# Patient Record
Sex: Male | Born: 1965 | Race: Black or African American | Hispanic: No | Marital: Married | Smoking: Former smoker
Health system: Southern US, Community
[De-identification: ages and names within clinical notes are randomized; demographics above are authoritative.]

## PROBLEM LIST (undated history)

## (undated) DIAGNOSIS — E669 Obesity, unspecified: Secondary | ICD-10-CM

## (undated) DIAGNOSIS — E785 Hyperlipidemia, unspecified: Secondary | ICD-10-CM

## (undated) DIAGNOSIS — G4730 Sleep apnea, unspecified: Secondary | ICD-10-CM

## (undated) DIAGNOSIS — E1165 Type 2 diabetes mellitus with hyperglycemia: Secondary | ICD-10-CM

## (undated) DIAGNOSIS — IMO0002 Reserved for concepts with insufficient information to code with codable children: Secondary | ICD-10-CM

## (undated) DIAGNOSIS — G4733 Obstructive sleep apnea (adult) (pediatric): Secondary | ICD-10-CM

## (undated) DIAGNOSIS — K219 Gastro-esophageal reflux disease without esophagitis: Secondary | ICD-10-CM

## (undated) DIAGNOSIS — I10 Essential (primary) hypertension: Secondary | ICD-10-CM

## (undated) DIAGNOSIS — T7840XA Allergy, unspecified, initial encounter: Secondary | ICD-10-CM

## (undated) HISTORY — DX: Sleep apnea, unspecified: G47.30

## (undated) HISTORY — DX: Hyperlipidemia, unspecified: E78.5

## (undated) HISTORY — DX: Obstructive sleep apnea (adult) (pediatric): G47.33

## (undated) HISTORY — DX: Essential (primary) hypertension: I10

## (undated) HISTORY — DX: Obesity, unspecified: E66.9

## (undated) HISTORY — DX: Type 2 diabetes mellitus with hyperglycemia: E11.65

## (undated) HISTORY — DX: Reserved for concepts with insufficient information to code with codable children: IMO0002

## (undated) HISTORY — DX: Allergy, unspecified, initial encounter: T78.40XA

## (undated) HISTORY — PX: TRIGGER FINGER RELEASE: SHX641

---

## 1999-03-04 ENCOUNTER — Emergency Department (HOSPITAL_COMMUNITY): Admission: EM | Admit: 1999-03-04 | Discharge: 1999-03-04 | Payer: Self-pay | Admitting: *Deleted

## 2000-01-08 ENCOUNTER — Ambulatory Visit (HOSPITAL_BASED_OUTPATIENT_CLINIC_OR_DEPARTMENT_OTHER): Admission: RE | Admit: 2000-01-08 | Discharge: 2000-01-08 | Payer: Self-pay | Admitting: Family Medicine

## 2000-03-14 ENCOUNTER — Ambulatory Visit (HOSPITAL_BASED_OUTPATIENT_CLINIC_OR_DEPARTMENT_OTHER): Admission: RE | Admit: 2000-03-14 | Discharge: 2000-03-14 | Payer: Self-pay | Admitting: Family Medicine

## 2002-09-02 ENCOUNTER — Emergency Department (HOSPITAL_COMMUNITY): Admission: EM | Admit: 2002-09-02 | Discharge: 2002-09-02 | Payer: Self-pay | Admitting: Emergency Medicine

## 2002-09-02 ENCOUNTER — Encounter: Payer: Self-pay | Admitting: Emergency Medicine

## 2004-06-02 ENCOUNTER — Ambulatory Visit (HOSPITAL_COMMUNITY): Admission: RE | Admit: 2004-06-02 | Discharge: 2004-06-02 | Payer: Self-pay | Admitting: *Deleted

## 2004-11-15 ENCOUNTER — Emergency Department (HOSPITAL_COMMUNITY): Admission: EM | Admit: 2004-11-15 | Discharge: 2004-11-16 | Payer: Self-pay | Admitting: Emergency Medicine

## 2006-10-01 ENCOUNTER — Ambulatory Visit: Payer: Self-pay | Admitting: Family Medicine

## 2006-10-08 ENCOUNTER — Ambulatory Visit: Payer: Self-pay | Admitting: Family Medicine

## 2006-10-15 ENCOUNTER — Encounter: Admission: RE | Admit: 2006-10-15 | Discharge: 2007-01-13 | Payer: Self-pay | Admitting: Family Medicine

## 2007-01-22 ENCOUNTER — Ambulatory Visit: Payer: Self-pay | Admitting: Family Medicine

## 2007-01-22 ENCOUNTER — Encounter: Admission: RE | Admit: 2007-01-22 | Discharge: 2007-01-22 | Payer: Self-pay | Admitting: Family Medicine

## 2007-11-22 ENCOUNTER — Ambulatory Visit: Payer: Self-pay | Admitting: Family Medicine

## 2007-11-29 ENCOUNTER — Ambulatory Visit: Payer: Self-pay | Admitting: Family Medicine

## 2008-01-06 ENCOUNTER — Ambulatory Visit: Payer: Self-pay | Admitting: Family Medicine

## 2008-01-08 ENCOUNTER — Encounter: Admission: RE | Admit: 2008-01-08 | Discharge: 2008-01-08 | Payer: Self-pay | Admitting: Family Medicine

## 2008-03-02 ENCOUNTER — Ambulatory Visit: Payer: Self-pay | Admitting: Family Medicine

## 2008-03-09 ENCOUNTER — Ambulatory Visit: Payer: Self-pay | Admitting: Family Medicine

## 2008-04-23 ENCOUNTER — Ambulatory Visit: Payer: Self-pay | Admitting: Family Medicine

## 2008-06-08 ENCOUNTER — Ambulatory Visit: Payer: Self-pay | Admitting: Family Medicine

## 2008-06-11 ENCOUNTER — Ambulatory Visit: Payer: Self-pay | Admitting: Family Medicine

## 2008-06-12 ENCOUNTER — Ambulatory Visit: Payer: Self-pay | Admitting: Family Medicine

## 2009-03-17 ENCOUNTER — Ambulatory Visit: Payer: Self-pay | Admitting: Family Medicine

## 2010-04-12 ENCOUNTER — Inpatient Hospital Stay (HOSPITAL_COMMUNITY): Admission: AD | Admit: 2010-04-12 | Discharge: 2010-04-14 | Payer: Self-pay | Admitting: Internal Medicine

## 2010-04-12 ENCOUNTER — Ambulatory Visit: Payer: Self-pay | Admitting: Diagnostic Radiology

## 2010-04-12 ENCOUNTER — Encounter: Payer: Self-pay | Admitting: Emergency Medicine

## 2010-04-12 ENCOUNTER — Ambulatory Visit: Payer: Self-pay | Admitting: Vascular Surgery

## 2010-04-18 ENCOUNTER — Ambulatory Visit: Payer: Self-pay | Admitting: Family Medicine

## 2010-05-19 ENCOUNTER — Ambulatory Visit: Payer: Self-pay | Admitting: Family Medicine

## 2010-09-01 ENCOUNTER — Ambulatory Visit (INDEPENDENT_AMBULATORY_CARE_PROVIDER_SITE_OTHER): Payer: 59 | Admitting: Family Medicine

## 2010-09-01 ENCOUNTER — Ambulatory Visit: Admit: 2010-09-01 | Payer: Self-pay | Admitting: Family Medicine

## 2010-09-01 DIAGNOSIS — E119 Type 2 diabetes mellitus without complications: Secondary | ICD-10-CM

## 2010-09-01 DIAGNOSIS — E785 Hyperlipidemia, unspecified: Secondary | ICD-10-CM

## 2010-09-01 DIAGNOSIS — E669 Obesity, unspecified: Secondary | ICD-10-CM

## 2010-09-01 DIAGNOSIS — I10 Essential (primary) hypertension: Secondary | ICD-10-CM

## 2010-10-13 LAB — LIPID PANEL
LDL Cholesterol: 133 mg/dL — ABNORMAL HIGH (ref 0–99)
Triglycerides: 131 mg/dL (ref <150)

## 2010-10-13 LAB — POCT CARDIAC MARKERS: Myoglobin, poc: 52.2 ng/mL (ref 12–200)

## 2010-10-13 LAB — PROTIME-INR
INR: 0.98 (ref 0.00–1.49)
Prothrombin Time: 13.2 seconds (ref 11.6–15.2)

## 2010-10-13 LAB — DIFFERENTIAL
Eosinophils Relative: 1 % (ref 0–5)
Lymphocytes Relative: 34 % (ref 12–46)
Lymphs Abs: 1.4 10*3/uL (ref 0.7–4.0)
Neutrophils Relative %: 55 % (ref 43–77)

## 2010-10-13 LAB — LIPASE, BLOOD: Lipase: 24 U/L (ref 11–59)

## 2010-10-13 LAB — GLUCOSE, CAPILLARY
Glucose-Capillary: 103 mg/dL — ABNORMAL HIGH (ref 70–99)
Glucose-Capillary: 148 mg/dL — ABNORMAL HIGH (ref 70–99)
Glucose-Capillary: 168 mg/dL — ABNORMAL HIGH (ref 70–99)

## 2010-10-13 LAB — CARDIAC PANEL(CRET KIN+CKTOT+MB+TROPI)
CK, MB: 1.2 ng/mL (ref 0.3–4.0)
Total CK: 98 U/L (ref 7–232)

## 2010-10-13 LAB — D-DIMER, QUANTITATIVE
D-Dimer, Quant: 2.5 ug/mL-FEU — ABNORMAL HIGH (ref 0.00–0.48)
D-Dimer, Quant: 2.57 ug/mL-FEU — ABNORMAL HIGH (ref 0.00–0.48)

## 2010-10-13 LAB — CBC
Hemoglobin: 15.1 g/dL (ref 13.0–17.0)
MCV: 84.7 fL (ref 78.0–100.0)
Platelets: 90 10*3/uL — ABNORMAL LOW (ref 150–400)
RBC: 5.36 MIL/uL (ref 4.22–5.81)
WBC: 4 10*3/uL (ref 4.0–10.5)

## 2010-10-13 LAB — BASIC METABOLIC PANEL
Chloride: 104 mEq/L (ref 96–112)
Creatinine, Ser: 0.9 mg/dL (ref 0.4–1.5)
GFR calc Af Amer: >60 mL/min (ref >60)
Potassium: 4.2 mEq/L (ref 3.5–5.1)
Sodium: 138 mEq/L (ref 135–145)

## 2010-10-13 LAB — HEMOGLOBIN A1C: Mean Plasma Glucose: 192 mg/dL — ABNORMAL HIGH (ref <117)

## 2010-11-21 ENCOUNTER — Ambulatory Visit: Payer: 59

## 2010-11-23 ENCOUNTER — Ambulatory Visit (INDEPENDENT_AMBULATORY_CARE_PROVIDER_SITE_OTHER): Payer: 59 | Admitting: *Deleted

## 2010-11-23 DIAGNOSIS — L0291 Cutaneous abscess, unspecified: Secondary | ICD-10-CM

## 2010-12-31 ENCOUNTER — Encounter: Payer: Self-pay | Admitting: Family Medicine

## 2010-12-31 DIAGNOSIS — G4730 Sleep apnea, unspecified: Secondary | ICD-10-CM | POA: Insufficient documentation

## 2010-12-31 DIAGNOSIS — E785 Hyperlipidemia, unspecified: Secondary | ICD-10-CM | POA: Insufficient documentation

## 2010-12-31 DIAGNOSIS — E1169 Type 2 diabetes mellitus with other specified complication: Secondary | ICD-10-CM | POA: Insufficient documentation

## 2010-12-31 DIAGNOSIS — E669 Obesity, unspecified: Secondary | ICD-10-CM | POA: Insufficient documentation

## 2011-01-03 ENCOUNTER — Ambulatory Visit: Payer: 59 | Admitting: Family Medicine

## 2011-03-06 ENCOUNTER — Ambulatory Visit (INDEPENDENT_AMBULATORY_CARE_PROVIDER_SITE_OTHER): Payer: 59 | Admitting: Medical

## 2011-03-06 ENCOUNTER — Encounter: Payer: Self-pay | Admitting: Medical

## 2011-03-06 VITALS — BP 110/80 | HR 98 | Temp 98.4°F | Ht 71.0 in | Wt 351.0 lb

## 2011-03-06 DIAGNOSIS — H5712 Ocular pain, left eye: Secondary | ICD-10-CM

## 2011-03-06 DIAGNOSIS — H5710 Ocular pain, unspecified eye: Secondary | ICD-10-CM

## 2011-03-06 DIAGNOSIS — S0592XA Unspecified injury of left eye and orbit, initial encounter: Secondary | ICD-10-CM

## 2011-03-06 DIAGNOSIS — S0510XA Contusion of eyeball and orbital tissues, unspecified eye, initial encounter: Secondary | ICD-10-CM

## 2011-03-06 MED ORDER — FLUORESCEIN SODIUM 1 MG OP STRP: 1.0000 | ORAL_STRIP | Freq: Once | OPHTHALMIC | Status: AC

## 2011-03-06 NOTE — Progress Notes (Signed)
Subjective:   HPI Mathew Ramsey is a 45 y.o. male who presents for left eye problem.  He notes 2 days ago he was playing with an air dart gun, and after it wouldn't shoot, he looked down the barrel and accidentally shot himself in the left eye with a rubber dart.  Since then he has redness, pain, irritation with bright light, watery drainage.  Vision seems off in the left eye.  No other aggravating or relieving factors.  No other c/o.  The following portions of the patient's history were reviewed and updated as appropriate: allergies, current medications, past family history, past medical history, past social history, past surgical history and problem list.   Review of Systems Gen: no fever, chills, sweats Skin: no rash Neuro: no numbness, tingling, headache     Objective:   Physical Exam  General appearance: alert, no distress, WD/WN, black male SKin: no periorbital erythema or ecchymosis. Eyes: tender over left globe, mild medial erythema, PERRLA, EOMi, normal red reflex, visual acuity listed as below, fluorescein stain negative for ulceration or abrasion, right eye exam normal     Assessment :    Encounter Diagnoses  Name Primary?  . Left eye trauma Yes  . Left eye pain       Plan:    Advised eye rest, and we will have him f/u with ophthalmology tomorrow for further eval.  There office was closed since it is already after 5pm. He has seen ophthalmology prior for routine care.

## 2011-05-08 ENCOUNTER — Encounter: Payer: 59 | Admitting: Family Medicine

## 2011-05-08 DIAGNOSIS — E669 Obesity, unspecified: Secondary | ICD-10-CM

## 2011-05-08 DIAGNOSIS — A084 Viral intestinal infection, unspecified: Secondary | ICD-10-CM

## 2011-05-08 DIAGNOSIS — E119 Type 2 diabetes mellitus without complications: Secondary | ICD-10-CM | POA: Insufficient documentation

## 2011-05-08 MED ORDER — METFORMIN HCL 850 MG PO TABS: 850.0000 mg | ORAL_TABLET | Freq: Two times a day (BID) | ORAL | Status: DC

## 2011-05-08 MED ORDER — PROMETHAZINE HCL 25 MG/ML IJ SOLN: 25.0000 mg | Freq: Four times a day (QID) | INTRAMUSCULAR | Status: DC | PRN

## 2011-05-08 MED ORDER — PROMETHAZINE HCL 50 MG/ML IJ SOLN: 50.0000 mg | Freq: Four times a day (QID) | INTRAMUSCULAR | Status: DC | PRN

## 2011-05-08 MED ORDER — PROMETHAZINE HCL 25 MG PO TABS: 25.0000 mg | ORAL_TABLET | Freq: Four times a day (QID) | ORAL | Status: DC | PRN

## 2011-05-08 NOTE — Progress Notes (Signed)
  Subjective:    Patient ID: ERASMUS BISTLINE, male    DOB: [birth year], 45 y.o.   MRN: [id number]  HPI He woke up this morning feeling fine and then became dizzy and had an episode of nausea and vomiting. No fever, chills, sore throat or earache. His blood sugars run in the low 100's. He has been losing weight lately states he has not really been trying. He continues on medications listed in the chart.   Review of Systems     Objective:   Physical Exam alert and in no distress. Tympanic membranes and canals are normal. Throat is clear. Tonsils are normal. Neck is supple without adenopathy or thyromegaly. Cardiac exam shows a regular sinus rhythm without murmurs or gallops. Lungs are clear to auscultation. Abdominal exam shows decreased bowel sounds without masses or tenderness. Hemoglobin A1c is 8.2.        Assessment & Plan:   1. Diabetes mellitus  POCT HgB A1C  2. Viral gastroenteritis  DISCONTINUED: promethazine (PHENERGAN) injection 25 mg  3. Obesity (BMI 30-39.9)     Phenergan injection and pills were given. Encouraged him to continue with his weight loss. I will increase his metformin to 850 twice a day. Return here in 4 months or sooner if further trouble

## 2011-05-08 NOTE — Patient Instructions (Signed)
Use the Phenergan as needed for your nausea and vomiting. Continue with your weight loss

## 2011-05-09 ENCOUNTER — Other Ambulatory Visit: Payer: Self-pay

## 2011-05-09 MED ORDER — PROMETHAZINE HCL 25 MG PO TABS: 25.0000 mg | ORAL_TABLET | Freq: Four times a day (QID) | ORAL | Status: AC | PRN

## 2011-07-18 ENCOUNTER — Ambulatory Visit (INDEPENDENT_AMBULATORY_CARE_PROVIDER_SITE_OTHER): Payer: 59 | Admitting: Medical

## 2011-07-18 ENCOUNTER — Other Ambulatory Visit: Payer: Self-pay | Admitting: Medical

## 2011-07-18 ENCOUNTER — Ambulatory Visit
Admission: RE | Admit: 2011-07-18 | Discharge: 2011-07-18 | Disposition: A | Discharge status: Home / Self Care | Payer: 59 | Source: Ambulatory Visit | Attending: Medical | Admitting: Medical

## 2011-07-18 ENCOUNTER — Encounter: Payer: Self-pay | Admitting: Medical

## 2011-07-18 VITALS — BP 120/88 | HR 72 | Temp 98.2°F | Resp 18 | Wt 359.0 lb

## 2011-07-18 DIAGNOSIS — M25562 Pain in left knee: Secondary | ICD-10-CM

## 2011-07-18 DIAGNOSIS — R52 Pain, unspecified: Secondary | ICD-10-CM

## 2011-07-18 DIAGNOSIS — M79609 Pain in unspecified limb: Secondary | ICD-10-CM

## 2011-07-18 DIAGNOSIS — M25569 Pain in unspecified knee: Secondary | ICD-10-CM

## 2011-07-18 DIAGNOSIS — M79606 Pain in leg, unspecified: Secondary | ICD-10-CM

## 2011-07-18 NOTE — Progress Notes (Signed)
Subjective:   HPI  Mathew Ramsey is a 45 y.o. male who presents with worsening left knee and calve pain.  Feels like knot in calve and behind knee.  He notes a playing kickball back in the summer, stopped abruptly, felt pop in knee, felt pulling burning pain.  Has given him problems intermittent since then.  Also lately feels pain in left calve.  Denies swelling, redness, numbness, no tingling.   Denies trauma.  No other injury.  No hx/o DVT.  No other aggravating or relieving factors.    No other c/o.  The following portions of the patient's history were reviewed and updated as appropriate: allergies, current medications, past family history, past medical history, past social history, past surgical history and problem list.  Past Medical History  Diagnosis Date  . Obesity   . Diabetes mellitus   . Hypertension   . Allergy   . Dyslipidemia   . Sleep apnea     Review of Systems Constitutional: -fever, -chills, -sweats, -unexpected -weight change,-fatigue ENT: -runny nose, -ear pain, -sore throat Cardiology:  -chest pain, -palpitations, +edema Respiratory: -cough, -shortness of breath, -wheezing Gastroenterology: -abdominal pain, -nausea, -vomiting, -diarrhea, -constipation Hematology: -bleeding or bruising problems Musculoskeletal: -arthralgias, -myalgias, -joint swelling, -back pain, +KNEE PAIN Ophthalmology: -vision changes Urology: -dysuria, -difficulty urinating, -hematuria, -urinary frequency, -urgency Neurology: -headache, -weakness, -tingling, -numbness     Objective:   Physical Exam  Filed Vitals:   07/18/11 1555  BP: 120/88  Pulse: 72  Temp: 98.2 F (36.8 C)  Resp: 18    General appearance: alert, no distress, WD/WN, obese black male Skin: unremarkable MSK: tender anteromedial left knee joint line, tender over posterior knee/popliteal area, tender left upper calve, but no cord, no obvious signs of clot, no asymmetry or swelling, otherwise left knee without  laxity, special tests normal, rest of LE exam unremarkable Pulses: 2+ symmetric,lower extremities, normal cap refill Neuro: normal strength and sensation of LE   Assessment and Plan :    Encounter Diagnoses  Name Primary?  . Knee pain, left Yes  . Leg pain, inferior    Etiology unclear.  Will send for xray.  I suspect a soft tissue injury such as tear, but exam is noncontributory.  Dr. Susann Givens also examined him and reviewed plan.  Follow-up pending xray.

## 2011-07-19 ENCOUNTER — Telehealth: Payer: Self-pay | Admitting: Medical

## 2011-07-19 NOTE — Progress Notes (Signed)
Addended by: Janeice Brewton on: 07/19/2011 02:19 PM   Modules accepted: Orders

## 2011-07-22 ENCOUNTER — Inpatient Hospital Stay: Admission: RE | Admit: 2011-07-22 | Payer: 59 | Source: Ambulatory Visit

## 2011-08-02 NOTE — Telephone Encounter (Signed)
TSD  

## 2011-09-11 ENCOUNTER — Encounter: Payer: Self-pay | Admitting: Family Medicine

## 2011-09-11 ENCOUNTER — Ambulatory Visit (INDEPENDENT_AMBULATORY_CARE_PROVIDER_SITE_OTHER): Payer: 59 | Admitting: Family Medicine

## 2011-09-11 DIAGNOSIS — E119 Type 2 diabetes mellitus without complications: Secondary | ICD-10-CM

## 2011-09-11 DIAGNOSIS — E669 Obesity, unspecified: Secondary | ICD-10-CM

## 2011-09-11 DIAGNOSIS — R6882 Decreased libido: Secondary | ICD-10-CM

## 2011-09-11 DIAGNOSIS — E785 Hyperlipidemia, unspecified: Secondary | ICD-10-CM

## 2011-09-11 DIAGNOSIS — G4730 Sleep apnea, unspecified: Secondary | ICD-10-CM

## 2011-09-11 DIAGNOSIS — M25569 Pain in unspecified knee: Secondary | ICD-10-CM

## 2011-09-11 DIAGNOSIS — Z79899 Other long term (current) drug therapy: Secondary | ICD-10-CM

## 2011-09-11 DIAGNOSIS — M25562 Pain in left knee: Secondary | ICD-10-CM

## 2011-09-11 LAB — COMPREHENSIVE METABOLIC PANEL
ALT: 30 U/L (ref 0–53)
AST: 22 U/L (ref 0–37)
BUN: 10 mg/dL (ref 6–23)
CO2: 26 mEq/L (ref 19–32)
Creat: 0.96 mg/dL (ref 0.50–1.35)
Total Bilirubin: 0.7 mg/dL (ref 0.3–1.2)

## 2011-09-11 LAB — LIPID PANEL
Cholesterol: 219 mg/dL — ABNORMAL HIGH (ref 0–200)
HDL: 52 mg/dL (ref >39)
Total CHOL/HDL Ratio: 4.2 Ratio
VLDL: 28 mg/dL (ref 0–40)

## 2011-09-11 LAB — CBC WITH DIFFERENTIAL/PLATELET
Basophils Absolute: 0 10*3/uL (ref 0.0–0.1)
Eosinophils Absolute: 0 10*3/uL (ref 0.0–0.7)
Eosinophils Relative: 1 % (ref 0–5)
MCH: 27.1 pg (ref 26.0–34.0)
MCHC: 32.8 g/dL (ref 30.0–36.0)
MCV: 82.5 fL (ref 78.0–100.0)
Monocytes Absolute: 0.3 10*3/uL (ref 0.1–1.0)
Platelets: 147 10*3/uL — ABNORMAL LOW (ref 150–400)
RDW: 13.8 % (ref 11.5–15.5)

## 2011-09-11 LAB — POCT UA - MICROALBUMIN: Creatinine, POC: 282.6 mg/dL

## 2011-09-11 MED ORDER — SIMVASTATIN 20 MG PO TABS: 20.0000 mg | ORAL_TABLET | Freq: Every day | ORAL | Status: DC

## 2011-09-11 MED ORDER — BENAZEPRIL HCL 10 MG PO TABS: 10.0000 mg | ORAL_TABLET | Freq: Every day | ORAL | Status: DC

## 2011-09-11 MED ORDER — CARVEDILOL 6.25 MG PO TABS: 6.2500 mg | ORAL_TABLET | Freq: Two times a day (BID) | ORAL | Status: DC

## 2011-09-11 MED ORDER — METFORMIN HCL 850 MG PO TABS: 850.0000 mg | ORAL_TABLET | Freq: Two times a day (BID) | ORAL | Status: DC

## 2011-09-11 NOTE — Patient Instructions (Signed)
Make an appointment with Darryl Hyers. I think this will be very helpful for you

## 2011-09-11 NOTE — Progress Notes (Signed)
  Subjective:    Patient ID: Mathew Ramsey, male    DOB: January 07, 1966, 46 y.o.   MRN: [id number]  HPI He is here for a followup visit. He states that the knee pain has greatly diminished. He has noted that when he drinks beer, the pain does recur. He has cut back on his alcohol consumption. He has had no popping, locking or grinding. He then discussed the fact that his entire life he has had difficulty doing things. He has had a very negative approach towards practically anything however when he would've been involved in the activity, he would do very well and enjoy it He recently ran out of several of his medications. He has not followed up on diabetes education. Nor does he exercise regularly. His work and home life seems to be going well. He does have underlying sleep apnea. He was told by his wife to let you know that he also has decreased libido. Further discussion indicates he also has decreased energy, strength and stamina. He does not check his sugars regularly.   Review of Systems Negative except as above    Objective:   Physical Exam Alert and in no distress otherwise not examined       Assessment & Plan:   1. Obesity  Ambulatory referral to diabetic education  2. Knee pain, left    3. Dyslipidemia  Lipid panel  4. Diabetes mellitus  CBC with Differential, Comprehensive metabolic panel, Lipid panel, POCT UA - Microalbumin, POCT HgB A1C, Ambulatory referral to diabetic education  5. Encounter for long-term (current) use of other medications  CBC with Differential, Comprehensive metabolic panel, Lipid panel  6. Libido, decreased    7. Sleep apnea     we had a long discussion concerning the psychological impact his negative he has had on his entire life. I recommended that he get counseling with Darryl Hyers to deal with all these issues. We'll set him up again for diabetes education. Discussed diet and exercise with him. I will also check testosterone level. Over 45 minutes spent  discussing all these issues with him.

## 2011-09-13 ENCOUNTER — Telehealth: Payer: Self-pay | Admitting: Internal Medicine

## 2011-09-13 MED ORDER — CARVEDILOL 6.25 MG PO TABS: 6.2500 mg | ORAL_TABLET | Freq: Two times a day (BID) | ORAL | Status: DC

## 2011-09-13 NOTE — Telephone Encounter (Signed)
Pt takes carvedilol twice a day so that would make it 180 for a 3 month supply so i just changed the quantity from 90 to 180

## 2011-10-30 ENCOUNTER — Telehealth: Payer: Self-pay | Admitting: Internal Medicine

## 2011-10-30 MED ORDER — METFORMIN HCL 850 MG PO TABS: 850.0000 mg | ORAL_TABLET | Freq: Two times a day (BID) | ORAL | Status: DC

## 2011-10-30 MED ORDER — SIMVASTATIN 20 MG PO TABS: 20.0000 mg | ORAL_TABLET | Freq: Every day | ORAL | Status: DC

## 2011-10-30 MED ORDER — BENAZEPRIL HCL 10 MG PO TABS: 10.0000 mg | ORAL_TABLET | Freq: Every day | ORAL | Status: DC

## 2011-10-30 MED ORDER — CARVEDILOL 6.25 MG PO TABS: 6.2500 mg | ORAL_TABLET | Freq: Two times a day (BID) | ORAL | Status: DC

## 2011-10-30 NOTE — Telephone Encounter (Signed)
Resent again per pt

## 2011-10-30 NOTE — Telephone Encounter (Signed)
pt wife called for [facility] stating that [facility] had the wrong address in the computer and that [facility] would not fill his meds from february. Pt needs a refill again since the address is now correct on benazepril 10mg ,simvastatin 20mg ,metformin 850mg , carvedilol 6.25 sent to [facility]

## 2011-10-30 NOTE — Telephone Encounter (Signed)
Resent med per pt

## 2011-10-30 NOTE — Telephone Encounter (Signed)
pts wife called and requested a [facility] [address] until they can get the meds through [facility]. Cheri refill benazepril 10mg  #90 with 3 refills, carvedilol 6.25mg  #180 with 3 refills. Metformin 850mg  #180 with 1 refill and simvastatin 20mg  #90 with 3 refills. And I refilled them all for a month and sent [facility] [address] since pt is out of it.

## 2011-11-02 ENCOUNTER — Ambulatory Visit (INDEPENDENT_AMBULATORY_CARE_PROVIDER_SITE_OTHER): Payer: 59 | Admitting: Family Medicine

## 2011-11-02 ENCOUNTER — Encounter: Payer: Self-pay | Admitting: Family Medicine

## 2011-11-02 VITALS — BP 140/90 | HR 60 | Wt 362.0 lb

## 2011-11-02 DIAGNOSIS — R1011 Right upper quadrant pain: Secondary | ICD-10-CM

## 2011-11-02 NOTE — Progress Notes (Signed)
  Subjective:    Patient ID: Mathew Ramsey, male    DOB: April 16, 1966, 46 y.o.   MRN: [id number]  HPI He complains of a several month history of swelling in the right upper quadrant that was made worse with foods. Approximately 5 days ago he had viral symptoms of weakness, headache, diaphoresis and diarrhea. Apparently for the people at work had the same problem. He also noted after that that eating food caused increased discomfort, bloating and gas in the right upper quadrant.   Review of Systems     Objective:   Physical Exam 13 in no distress. Cardiac exam shows regular rhythm without murmurs or gallops. Lungs clear to auscultation. Abdominal exam shows positive Murphy sign and Murphy's punch with decreased bowel sounds       Assessment & Plan:   1. Right upper quadrant pain  US Abdomen Limited

## 2011-11-06 ENCOUNTER — Ambulatory Visit
Admission: RE | Admit: 2011-11-06 | Discharge: 2011-11-06 | Disposition: A | Discharge status: Home / Self Care | Payer: 59 | Source: Ambulatory Visit | Attending: Family Medicine | Admitting: Family Medicine

## 2011-11-06 ENCOUNTER — Other Ambulatory Visit: Payer: Self-pay

## 2011-11-06 DIAGNOSIS — R1011 Right upper quadrant pain: Secondary | ICD-10-CM

## 2011-11-13 ENCOUNTER — Telehealth: Payer: Self-pay | Admitting: Family Medicine

## 2011-11-13 NOTE — Telephone Encounter (Signed)
Pt informed that HIDA scan should not cause symptoms of claustrophobia.

## 2011-11-13 NOTE — Telephone Encounter (Signed)
Pt has Heidis Scan scheduled for tomorrow and wants something rx for the claustrophobia.  [facility] [address].  Please let him know if that can be done.

## 2011-11-13 NOTE — Telephone Encounter (Signed)
Explained to him that this should not cause symptoms of claustrophobia

## 2011-11-14 ENCOUNTER — Inpatient Hospital Stay (HOSPITAL_COMMUNITY): Admission: RE | Admit: 2011-11-14 | Payer: 59 | Source: Ambulatory Visit

## 2011-11-15 ENCOUNTER — Telehealth: Payer: Self-pay | Admitting: Internal Medicine

## 2011-11-15 NOTE — Telephone Encounter (Signed)
Called pt and left message on cell that HYDA SCAN is scheduled for April 26,2013 @8 :00am @ [facility]. Nothing to drink or eat after midnight and DO NOT take meds the morning of scan.. Prior Auth was approved. Confirmation # [id number].

## 2011-11-24 ENCOUNTER — Encounter (HOSPITAL_COMMUNITY): Payer: Self-pay

## 2011-11-24 ENCOUNTER — Encounter (HOSPITAL_COMMUNITY)
Admission: RE | Admit: 2011-11-24 | Discharge: 2011-11-24 | Disposition: A | Discharge status: Home / Self Care | Payer: 59 | Source: Ambulatory Visit | Attending: Family Medicine | Admitting: Family Medicine

## 2011-11-24 DIAGNOSIS — R1011 Right upper quadrant pain: Secondary | ICD-10-CM | POA: Insufficient documentation

## 2011-11-24 MED ORDER — TECHNETIUM TC 99M MEBROFENIN IV KIT
5.5000 | PACK | Freq: Once | INTRAVENOUS | Status: AC | PRN
  Administered 2011-11-24: 5.5 via INTRAVENOUS

## 2011-11-24 MED ORDER — SINCALIDE 5 MCG IJ SOLR: 0.0200 ug/kg | Freq: Once | INTRAMUSCULAR | Status: DC

## 2011-11-28 ENCOUNTER — Telehealth: Payer: Self-pay

## 2011-11-28 NOTE — Telephone Encounter (Signed)
Pt has called and wanted results from Hida scan from Friday it is in his chart but there are no comments from you that I can see please advise

## 2011-11-28 NOTE — Telephone Encounter (Signed)
Left message on machine word for word 

## 2011-11-28 NOTE — Telephone Encounter (Signed)
There is no evidence of gallbladder disease. Make sure he is on a PPI and followup with me in 2-4 weeks

## 2011-12-05 ENCOUNTER — Telehealth: Payer: Self-pay

## 2011-12-05 ENCOUNTER — Ambulatory Visit: Payer: 59 | Admitting: *Deleted

## 2011-12-05 NOTE — Telephone Encounter (Signed)
Wife called [facility] today said they had not received any info on hida scan called husband the Pt Kahner I informed him that I did call at 12:10 pm and left message word for word of results that no evidence of gallbladder problems and to take Pepcid and to follow up with Dr.Lalonde in 2 to 4 weeks pt said ok

## 2012-01-08 ENCOUNTER — Ambulatory Visit (INDEPENDENT_AMBULATORY_CARE_PROVIDER_SITE_OTHER): Payer: 59 | Admitting: Family Medicine

## 2012-01-08 ENCOUNTER — Encounter: Payer: Self-pay | Admitting: Family Medicine

## 2012-01-08 VITALS — BP 130/80 | HR 106 | Wt 360.0 lb

## 2012-01-08 DIAGNOSIS — E785 Hyperlipidemia, unspecified: Secondary | ICD-10-CM

## 2012-01-08 DIAGNOSIS — Z9119 Patient's noncompliance with other medical treatment and regimen: Secondary | ICD-10-CM

## 2012-01-08 DIAGNOSIS — E119 Type 2 diabetes mellitus without complications: Secondary | ICD-10-CM

## 2012-01-08 DIAGNOSIS — G4730 Sleep apnea, unspecified: Secondary | ICD-10-CM

## 2012-01-08 DIAGNOSIS — E669 Obesity, unspecified: Secondary | ICD-10-CM

## 2012-01-08 NOTE — Patient Instructions (Signed)
Change your mind

## 2012-01-08 NOTE — Progress Notes (Signed)
  Subjective:    Patient ID: Mathew Ramsey, male    DOB: [birth year], 46 y.o.   MRN: [id number]  HPI He is here for a diabetes recheck. He continues on medications listed in the chart. His social history was reviewed. He does not check his sugars regularly, exercise is quite minimal and he has yet to make permanent eating habit changes. Review his record indicates he did fairly good job in his originally diagnosed however he has subsequently had difficulty staying on task. He is not using his CPAP at the present time.   Review of Systems     Objective:   Physical Exam Alert and in no distress. Hemoglobin A1c is 10.1.       Assessment & Plan:   1. Type II or unspecified type diabetes mellitus without mention of complication, not stated as uncontrolled  POCT HgB A1C  2. Obesity    3. Dyslipidemia    4. Diabetes mellitus    5. Sleep apnea    6. Personal history of noncompliance with medical treatment, presenting hazards to health     I again had a long discussion with him concerning his noncompliance and wrist is overall health. Strongly encouraged him to make permanent lifestyle changes in regard to his diet and exercise. Also encouraged him to start using his CPAP machine again. Recheck here in several months. He did state that when he quit smoking he needed to just change his mind which was my strong suggestion for him in regard to his diet and exercise.

## 2012-01-25 ENCOUNTER — Emergency Department (HOSPITAL_BASED_OUTPATIENT_CLINIC_OR_DEPARTMENT_OTHER): Payer: 59

## 2012-01-25 ENCOUNTER — Encounter (HOSPITAL_BASED_OUTPATIENT_CLINIC_OR_DEPARTMENT_OTHER): Payer: Self-pay | Admitting: *Deleted

## 2012-01-25 ENCOUNTER — Emergency Department (HOSPITAL_BASED_OUTPATIENT_CLINIC_OR_DEPARTMENT_OTHER)
Admission: EM | Admit: 2012-01-25 | Discharge: 2012-01-25 | Disposition: A | Discharge status: Home / Self Care | Payer: 59 | Attending: Emergency Medicine | Admitting: Emergency Medicine

## 2012-01-25 DIAGNOSIS — D72819 Decreased white blood cell count, unspecified: Secondary | ICD-10-CM

## 2012-01-25 DIAGNOSIS — E119 Type 2 diabetes mellitus without complications: Secondary | ICD-10-CM | POA: Insufficient documentation

## 2012-01-25 DIAGNOSIS — R079 Chest pain, unspecified: Secondary | ICD-10-CM | POA: Insufficient documentation

## 2012-01-25 DIAGNOSIS — R42 Dizziness and giddiness: Secondary | ICD-10-CM | POA: Insufficient documentation

## 2012-01-25 DIAGNOSIS — D696 Thrombocytopenia, unspecified: Secondary | ICD-10-CM | POA: Insufficient documentation

## 2012-01-25 DIAGNOSIS — I10 Essential (primary) hypertension: Secondary | ICD-10-CM | POA: Insufficient documentation

## 2012-01-25 LAB — COMPREHENSIVE METABOLIC PANEL
ALT: 26 U/L (ref 0–53)
Alkaline Phosphatase: 75 U/L (ref 39–117)
CO2: 26 mEq/L (ref 19–32)
Chloride: 99 mEq/L (ref 96–112)
GFR calc Af Amer: >90 mL/min (ref >90)
GFR calc non Af Amer: >90 mL/min (ref >90)
Glucose, Bld: 212 mg/dL — ABNORMAL HIGH (ref 70–99)
Potassium: 3.9 mEq/L (ref 3.5–5.1)
Sodium: 133 mEq/L — ABNORMAL LOW (ref 135–145)

## 2012-01-25 LAB — PROTIME-INR: Prothrombin Time: 13.8 seconds (ref 11.6–15.2)

## 2012-01-25 LAB — DIFFERENTIAL
Lymphocytes Relative: 39 % (ref 12–46)
Lymphs Abs: 1.3 10*3/uL (ref 0.7–4.0)
Neutrophils Relative %: 50 % (ref 43–77)

## 2012-01-25 LAB — CBC
MCV: 76.9 fL — ABNORMAL LOW (ref 78.0–100.0)
Platelets: 136 10*3/uL — ABNORMAL LOW (ref 150–400)
RBC: 5.49 MIL/uL (ref 4.22–5.81)
WBC: 3.4 10*3/uL — ABNORMAL LOW (ref 4.0–10.5)

## 2012-01-25 LAB — URINALYSIS, ROUTINE W REFLEX MICROSCOPIC
Bilirubin Urine: NEGATIVE
Hgb urine dipstick: NEGATIVE
Protein, ur: NEGATIVE mg/dL
Urobilinogen, UA: 0.2 mg/dL (ref 0.0–1.0)

## 2012-01-25 MED ORDER — SODIUM CHLORIDE 0.9 % IV BOLUS (SEPSIS)
1000.0000 mL | Freq: Once | INTRAVENOUS | Status: AC
  Administered 2012-01-25: 1000 mL via INTRAVENOUS

## 2012-01-25 NOTE — ED Notes (Signed)
Oxygen placed via n/c at 2lpm

## 2012-01-25 NOTE — ED Notes (Signed)
Pt declines w/c, amb to room 7 with quick steady gait in nad. Pt reports having intermittent "dizzy spells" for one week, mostly in the mornings. Pt states this morning his dizziness was worse than usual. Denies ha, visual changes, or any other c/o.

## 2012-01-25 NOTE — ED Notes (Signed)
ekg and cbg performed while pt being triaged.

## 2012-01-25 NOTE — ED Provider Notes (Signed)
History     CSN: [id number]  Arrival date & time 01/25/12  1051   First MD Initiated Contact with Patient 01/25/12 1115      Chief Complaint  Patient presents with  . Dizziness    (Consider location/radiation/quality/duration/timing/severity/associated sxs/prior treatment) HPI Patient is a 71 your old male who presents today complaining of multiple episodes of lightheadedness was most severe of these occurring today. Patient denies any loss of consciousness, shortness of breath, vomiting, fevers, or other neurologic symptoms with these episodes. He does endorse some occasional chest pain with this. He denies palpitations with it. Today's episode lasted the longest and was 2 hours in duration. Patient was able to drive himself here. He has never checked his blood sugar during these events but reports that his blood sugars have improved and have been between 180 and 200 recently. He has made recent diet changes. Patient denies any history of coronary artery disease as well as any family history before the age of 108. He has never had a stress test or cardiac catheterization. Patient does have a history of hypertension as well as diabetes and hyperlipidemia. He is absolutely symptom-free at this time. Patient reports that the chest pain he has experienced has been momentary and mild. Patient denies any associated visual changes, dysarthria, numbness, tingling, or paresthesias with his episodes. He does have a primary care physician who follows him and he reports taking his medications as prescribed. Patient is hemodynamically stable upon presentation but no hypertension. He is quite anxious upon arrival. There are no other associated or modifying factors. Past Medical History  Diagnosis Date  . Obesity   . Diabetes mellitus   . Hypertension   . Allergy   . Dyslipidemia   . Sleep apnea     History reviewed. No pertinent past surgical history.  History reviewed. No pertinent family  history.  History  Substance Use Topics  . Smoking status: Never Smoker   . Smokeless tobacco: Never Used  . Alcohol Use: 1.0 oz/week    2 drink(s) per week      Review of Systems  Constitutional: Negative.   HENT: Negative.   Eyes: Negative.   Respiratory: Negative.   Cardiovascular: Positive for chest pain.  Gastrointestinal: Positive for nausea.  Genitourinary: Negative.   Musculoskeletal: Negative.   Skin: Negative.   Neurological: Positive for light-headedness.  Hematological: Negative.   Psychiatric/Behavioral: Negative.   All other systems reviewed and are negative.    Allergies  Sulfa antibiotics  Home Medications   Current Outpatient Rx  Name Route Sig Dispense Refill  . ASPIRIN 81 MG PO TABS Oral Take 81 mg by mouth daily.      Marland Kitchen BENAZEPRIL HCL 10 MG PO TABS Oral Take 1 tablet (10 mg total) by mouth daily. 30 tablet 0  . CARVEDILOL 6.25 MG PO TABS Oral Take 1 tablet (6.25 mg total) by mouth 2 (two) times daily with a meal. 60 tablet 0  . LANSOPRAZOLE 15 MG PO CPDR Oral Take 15 mg by mouth daily. Patient uses this medication for ulcers.    Marland Kitchen METFORMIN HCL 850 MG PO TABS Oral Take 1 tablet (850 mg total) by mouth 2 (two) times daily with a meal. 60 tablet 0  . SIMVASTATIN 20 MG PO TABS Oral Take 1 tablet (20 mg total) by mouth at bedtime. 30 tablet 0    BP 134/89  Pulse 89  Temp 98.2 F (36.8 C) (Oral)  Resp 20  Ht 5\' 11"  (1.803 m)  Wt 350 lb (158.759 kg)  BMI 48.82 kg/m2  SpO2 100%  Physical Exam  Nursing note and vitals reviewed. GEN: Well-developed, well-nourished male in no distress HEENT: Atraumatic, normocephalic. Oropharynx clear without erythema EYES: PERRLA BL, no scleral icterus. NECK: Trachea midline, no meningismus CV: Tachycardia with rate less than 110 and regular rhythm. No murmurs, rubs, or gallops PULM: No respiratory distress.  No crackles, wheezes, or rales. GI: soft, non-tender. No guarding, rebound, or tenderness. + bowel  sounds  GU: deferred Neuro: cranial nerves grossly 2-12 intact, no abnormalities of strength or sensation, A and O x 3 MSK: Patient moves all 4 extremities symmetrically, no deformity, edema, or injury noted Skin: No rashes petechiae, purpura, or jaundice Psych: no abnormality of mood   ED Course  Procedures (including critical care time)  Indication: dizziness Please note this EKG was reviewed extemporaneously by myself.   Date: 01/25/2012  Rate: 102  Rhythm: sinus tachycardia  QRS Axis: normal  Intervals: normal  ST/T Wave abnormalities: nonspecific T wave changes  Conduction Disutrbances:none  Narrative Interpretation:   Old EKG Reviewed: unchanged      Labs Reviewed  GLUCOSE, CAPILLARY - Abnormal; Notable for the following:    Glucose-Capillary 210 (*)     All other components within normal limits  CBC - Abnormal; Notable for the following:    WBC 3.4 (*)     MCV 76.9 (*)     Platelets 136 (*)     All other components within normal limits  COMPREHENSIVE METABOLIC PANEL - Abnormal; Notable for the following:    Sodium 133 (*)     Glucose, Bld 212 (*)     All other components within normal limits  URINALYSIS, ROUTINE W REFLEX MICROSCOPIC - Abnormal; Notable for the following:    Glucose, UA 250 (*)     All other components within normal limits  PROTIME-INR  APTT  DIFFERENTIAL  TROPONIN I  TROPONIN I   Dg Chest 2 View  01/25/2012  *RADIOLOGY REPORT*  Clinical Data: Dizzy spells, hypertension, diabetes, obesity  CHEST - 2 VIEW  Comparison: 04/12/2010  Findings: Upper-normal size of cardiac silhouette. Mediastinal contours and pulmonary vascularity normal. No pulmonary infiltrate, pleural effusion or pneumothorax. No acute osseous findings.  IMPRESSION: No acute abnormalities.  Original Report Authenticated By: Lollie Marrow, M.D.   Ct Head Wo Contrast  01/25/2012  *RADIOLOGY REPORT*  Clinical Data: Dizziness.  CT HEAD WITHOUT CONTRAST  Technique:  Contiguous axial  images were obtained from the base of the skull through the vertex without contrast.  Comparison: No priors.  Findings: No acute intracranial abnormalities.  Specifically, no definite signs of acute/subacute cerebral ischemia, no acute intracranial hemorrhage, no focal mass, mass effect, hydrocephalus or abnormal intra or extra-axial fluid collections.  No acute displaced skull fractures are identified.  Visualized paranasal sinuses and mastoids are well pneumatized.  IMPRESSION: 1.  No acute intracranial abnormalities. 2.  The appearance of the brain is normal.  Original Report Authenticated By: Florencia Reasons, M.D.     1. Dizziness - light-headed   2. Leukopenia   3. Thrombocytopenia       MDM  Patient was evaluated by myself. He was completely asymptomatic upon arrival. Patient was hemodynamically stable and anxious regarding these episodes he has been experiencing. I thought the patient was relatively low risk for TIA or stroke but given his medical history I did perform workup including a head CT which was negative area and as he was asymptomatic no  further imaging was warranted today. Patient also had reported occasionally having chest pain with these episodes. He was absolutely chest pain free here and his description of symptoms would be very atypical for ACS. Given his risk factors I did check an EKG, chest x-ray, CBC, BMP, and 0 and 3 hour troponins. Lab results were remarkable for persistent leukopenia and thrombocytopenia as seen on multiple previous labs with no other abnormalities. Patient had seen a cardiologist once in the past after being told he had cardiomegaly and this was not seen on chest x-ray today. Presentation was not consistent with heart failure either. Patient remained completely asymptomatic during his time in the emergency department. As he has established primary care physician I discussed that he should followup with them regarding his episodes. We discussed that they  may want to perform Holter monitoring or refer him to the cardiologist for stress testing. We discussed that stress testing might be of particular importance given that the patient is considering starting a new exercise regimen and he has had multiple episodes like this. Patient was also advised to keep his glucometer near him and to consider rechecking his blood sugar at the next episode. I have low suspicion that he is experiencing hypoglycemia but this possibility should be ruled out. Patient was discharged in good condition and comfortable with plan to follow up with his primary care physician.        Cyndra Numbers, MD 01/27/12 [phone]

## 2012-01-25 NOTE — Discharge Instructions (Signed)
Dizziness Dizziness is a common problem. It is a feeling of unsteadiness or lightheadedness. You may feel like you are about to faint. Dizziness can lead to injury if you stumble or fall. A person of any age group can suffer from dizziness, but dizziness is more common in older adults. CAUSES  Dizziness can be caused by many different things, including:  Middle ear problems.   Standing for too long.   Infections.   An allergic reaction.   Aging.   An emotional response to something, such as the sight of blood.   Side effects of medicines.   Fatigue.   Problems with circulation or blood pressure.   Excess use of alcohol, medicines, or illegal drug use.   Breathing too fast (hyperventilation).   An arrhythmia or problems with your heart rhythm.   Low red blood cell count (anemia).   Pregnancy.   Vomiting, diarrhea, fever, or other illnesses that cause dehydration.   Diseases or conditions such as Parkinson's disease, high blood pressure (hypertension), diabetes, and thyroid problems.   Exposure to extreme heat.  DIAGNOSIS  To find the cause of your dizziness, your caregiver may do a physical exam, lab tests, radiologic imaging scans, or an electrocardiography test (ECG).  TREATMENT  Treatment of dizziness depends on the cause of your symptoms and can vary greatly. HOME CARE INSTRUCTIONS   Drink enough fluids to keep your urine clear or pale yellow. This is especially important in very hot weather. In the elderly, it is also important in cold weather.   If your dizziness is caused by medicines, take them exactly as directed. When taking blood pressure medicines, it is especially important to get up slowly.   Rise slowly from chairs and steady yourself until you feel okay.   In the morning, first sit up on the side of the bed. When this seems okay, stand slowly while holding onto something until you know your balance is fine.   If you need to stand in one place for a  long time, be sure to move your legs often. Tighten and relax the muscles in your legs while standing.   If dizziness continues to be a problem, have someone stay with you for a day or two. Do this until you feel you are well enough to stay alone. Have the person call your caregiver if he or she notices changes in you that are concerning.   Do not drive or use heavy machinery if you feel dizzy.  SEEK IMMEDIATE MEDICAL CARE IF:   Your dizziness or lightheadedness gets worse.   You feel nauseous or vomit.   You develop problems with talking, walking, weakness, or using your arms, hands, or legs.   You are not thinking clearly or you have difficulty forming sentences. It may take a friend or family member to determine if your thinking is normal.   You develop chest pain, abdominal pain, shortness of breath, or sweating.   Your vision changes.   You notice any bleeding.   You have side effects from medicine that seems to be getting worse rather than better.  MAKE SURE YOU:   Understand these instructions.   Will watch your condition.   Will get help right away if you are not doing well or get worse.  Document Released: 01/10/2001 Document Revised: 07/06/2011 Document Reviewed: 02/03/2011 [facility] Patient Information 2012 [place], [place].Thrombocytopenia Thrombocytopenia is a condition in which there is an abnormally small number of platelets in your blood. Platelets are also  called thrombocytes. Platelets are needed for blood clotting. CAUSES Thrombocytopenia is caused by:   Decreased production of platelets. This can be caused by:   Aplastic anemia in which your bone marrow quits making blood cells.   Cancer in the bone marrow.   Use of certain medicines, including chemotherapy.   Infection in the bone marrow.   Heavy alcohol consumption.   Increased destruction of platelets. This can be caused by:   Certain immune diseases.   Use of certain drugs.   Certain blood  clotting disorders.   Certain inherited disorders.   Certain bleeding disorders.   Pregnancy.   Having an enlarged spleen (hypersplenism). In hypersplenism, the spleen gathers up platelets from circulation. This means the platelets are not available to help with blood clotting. The spleen can enlarge due to cirrhosis or other conditions.  SYMPTOMS  The symptoms of thrombocytopenia are side effects of poor blood clotting. Some of these are:  Abnormal bleeding.   Nosebleeds.   Heavy menstrual periods.   Blood in the urine or stools.   Purpura. This is a purplish discoloration in the skin produced by small bleeding vessels near the surface of the skin.   Bruising.   A rash that may be petechial. This looks like pinpoint, purplish-red spots on the skin and mucous membranes. It is caused by bleeding from small blood vessels (capillaries).  DIAGNOSIS  Your caregiver will make this diagnosis based on your exam and blood tests. Sometimes, a bone marrow study is done to look for the original cells (megakaryocytes) that make platelets. TREATMENT  Treatment depends on the cause of the condition.  Medicines may be given to help protect your platelets from being destroyed.   In some cases, a replacement (transfusion) of platelets may be required to stop or prevent bleeding.   Sometimes, the spleen must be surgically removed.  HOME CARE INSTRUCTIONS   Check the skin and linings inside your mouth for bruising or bleeding as directed by your caregiver.   Check your sputum, urine, and stool for blood as directed by your caregiver.   Do not return to any activities that could cause bumps or bruises until your caregiver says it is okay.   Take extra care not to cut yourself when shaving or when using scissors, needles, knives, and other tools.   Take extra care not to burn yourself when ironing or cooking.   Ask your caregiver if it is okay for you to drink alcohol.   Only take  over-the-counter or prescription medicines as directed by your caregiver.   Notify all your caregivers, including dentists and eye doctors, about your condition.  SEEK IMMEDIATE MEDICAL CARE IF:   You develop active bleeding from anywhere in your body.   You develop unexplained bruising or bleeding.   You have blood in your sputum, urine, or stool.  MAKE SURE YOU:  Understand these instructions.   Will watch your condition.   Will get help right away if you are not doing well or get worse.  Document Released: 07/17/2005 Document Revised: 07/06/2011 Document Reviewed: 05/19/2011 [facility] Patient Information 2012 [place], [place].Near-Syncope Near-syncope is sudden weakness, dizziness, or feeling like you might pass out (faint). This may occur when getting up after sitting or while standing for a long period of time. Near-syncope can be caused by a drop in blood pressure. This is a common reaction, but it may occur to a greater degree in people taking medicines to control their blood pressure. Fainting often occurs  when the blood pressure or pulse is too low to provide enough blood flow to the brain to keep you conscious. Fainting and near-syncope are not usually due to serious medical problems. However, certain people should be more cautious in the event of near-syncope, including elderly patients, patients with diabetes, and patients with a history of heart conditions (especially irregular rhythms).  CAUSES   Drop in blood pressure.   Physical pain.   Dehydration.   Heat exhaustion.   Emotional distress.   Low blood sugar.   Internal bleeding.   Heart and circulatory problems.   Infections.  SYMPTOMS   Dizziness.   Feeling sick to your stomach (nauseous).   Nearly fainting.   Body numbness.   Turning pale.   Tunnel vision.   Weakness.  HOME CARE INSTRUCTIONS   Lie down right away if you start feeling like you might faint. Breathe deeply and steadily. Wait until  all the symptoms have passed. Most of these episodes last only a few minutes. You may feel tired for several hours.   Drink enough fluids to keep your urine clear or pale yellow.   If you are taking blood pressure or heart medicine, get up slowly, taking several minutes to sit and then stand. This can reduce dizziness that is caused by a drop in blood pressure.  SEEK IMMEDIATE MEDICAL CARE IF:   You have a severe headache.   Unusual pain develops in the chest, abdomen, or back.   There is bleeding from the mouth or rectum, or you have black or tarry stool.   An irregular heartbeat or a very rapid pulse develops.   You have repeated fainting or seizure-like jerking during an episode.   You faint when sitting or lying down.   You develop confusion.   You have difficulty walking.   Severe weakness develops.   Vision problems develop.  MAKE SURE YOU:   Understand these instructions.   Will watch your condition.   Will get help right away if you are not doing well or get worse.  Document Released: 07/17/2005 Document Revised: 07/06/2011 Document Reviewed: 09/02/2010 [facility] Patient Information 2012 [place], [place].

## 2012-01-29 ENCOUNTER — Encounter: Payer: Self-pay | Admitting: Family Medicine

## 2012-01-29 ENCOUNTER — Ambulatory Visit (INDEPENDENT_AMBULATORY_CARE_PROVIDER_SITE_OTHER): Payer: 59 | Admitting: Family Medicine

## 2012-01-29 VITALS — BP 128/88 | HR 95 | Wt 362.0 lb

## 2012-01-29 DIAGNOSIS — R42 Dizziness and giddiness: Secondary | ICD-10-CM

## 2012-01-29 DIAGNOSIS — R5383 Other fatigue: Secondary | ICD-10-CM

## 2012-01-29 DIAGNOSIS — R6882 Decreased libido: Secondary | ICD-10-CM

## 2012-01-29 DIAGNOSIS — E119 Type 2 diabetes mellitus without complications: Secondary | ICD-10-CM

## 2012-01-29 DIAGNOSIS — E669 Obesity, unspecified: Secondary | ICD-10-CM

## 2012-01-29 NOTE — Progress Notes (Signed)
  Subjective:    Patient ID: Mathew Ramsey, male    DOB: 1965-09-12, 46 y.o.   MRN: [id number]  HPI He is here for followup visit after recent visit to the emergency room for evaluation of vertigo. The medical record was reviewed including history and exam. The exam including blood work, EKG and CT scan was all negative. He continues to have difficulty with intermittent dizziness. He also complains of noticing decreased energy, stamina and libido over the last several months. He does have an underlying history of diabetes and has started to take better care of himself in that regard. He has made some dietary changes but then admits to not eating breakfast and then having snacks of almonds and Nabs mid morning.  Review of Systems     Objective:   Physical Exam alert and in no distress. Tympanic membranes and canals are normal. Throat is clear. Tonsils are normal. Neck is supple without adenopathy or thyromegaly. Cardiac exam shows a regular sinus rhythm without murmurs or gallops. Lungs are clear to auscultation.        Assessment & Plan:   1. Libido, decreased  Testosterone  2. Vertigo    3. Obesity  Amb ref to Medical Nutrition Therapy-MNT  4. Fatigue  Testosterone  5. Diabetes mellitus  Amb ref to Medical Nutrition Therapy-MNT   we again had a long discussion concerning diet, exercise, in general taking better care of himself. He is now interested in seeing the nutritionist. 25 minutes spent discussing all these issues with him.

## 2012-01-30 ENCOUNTER — Telehealth: Payer: Self-pay

## 2012-01-30 LAB — TESTOSTERONE: Testosterone: 216.89 ng/dL — ABNORMAL LOW (ref 300–890)

## 2012-01-30 NOTE — Telephone Encounter (Signed)
Left message on pt phone to please make appt to discuss labs with [facility]

## 2012-01-31 ENCOUNTER — Ambulatory Visit (INDEPENDENT_AMBULATORY_CARE_PROVIDER_SITE_OTHER): Payer: 59 | Admitting: Family Medicine

## 2012-01-31 DIAGNOSIS — E291 Testicular hypofunction: Secondary | ICD-10-CM | POA: Insufficient documentation

## 2012-01-31 LAB — PSA: PSA: 0.7 ng/mL (ref <=4.00)

## 2012-01-31 MED ORDER — TESTOSTERONE 20.25 MG/1.25GM (1.62%) TD GEL: 2.0000 "application " | TRANSDERMAL | Status: DC

## 2012-01-31 NOTE — Progress Notes (Signed)
  Subjective:    Patient ID: Mathew Ramsey, male    DOB: Mar 09, 1966, 46 y.o.   MRN: [id number]  HPI He is here for consultation. She did have recent testosterone level which was in the low 200 range. He does have a previous low testosterone. He took this long a followup due to his own delay. He does have difficulty with decreased strength as well as stamina and libido. He also has underlying sleep apnea, diabetes and obesity.   Review of Systems     Objective:   Physical Exam Alert and in no distress otherwise not examined      Assessment & Plan:   1. Hypogonadism male  PSA, Medicare, Testosterone (ANDROGEL) 20.25 MG/1.25GM (1.62%) GEL   I discussed treatment of his hypogonadism and its relationship to his weight, diabetes, sleep apnea etc. Strongly encouraged him to use this again as a reason to make lifestyle changes. Discussed the possibility of retesting him if he loses down to roughly 20% body fat. Instructed him on proper use of testosterone replacement and possible side effects. Regular PSA ordered. He is not on Medicare

## 2012-02-02 ENCOUNTER — Telehealth: Payer: Self-pay | Admitting: Family Medicine

## 2012-02-07 ENCOUNTER — Telehealth: Payer: Self-pay | Admitting: Internal Medicine

## 2012-02-07 NOTE — Telephone Encounter (Signed)
Pt was approved for androgel 1.62% from 01/07/12-02/05/13. Pt was left a message stating it was approved and i called the pharmacy to let them know it had been approved and to go ahead and fill it

## 2012-02-12 NOTE — Telephone Encounter (Signed)
LM

## 2012-02-19 ENCOUNTER — Encounter: Payer: 59 | Attending: Family Medicine | Admitting: *Deleted

## 2012-02-19 ENCOUNTER — Encounter: Payer: Self-pay | Admitting: *Deleted

## 2012-02-19 VITALS — Ht 71.5 in | Wt 365.9 lb

## 2012-02-19 DIAGNOSIS — E669 Obesity, unspecified: Secondary | ICD-10-CM

## 2012-02-19 DIAGNOSIS — E119 Type 2 diabetes mellitus without complications: Secondary | ICD-10-CM | POA: Insufficient documentation

## 2012-02-19 DIAGNOSIS — Z713 Dietary counseling and surveillance: Secondary | ICD-10-CM | POA: Insufficient documentation

## 2012-02-19 NOTE — Progress Notes (Signed)
  Medical Nutrition Therapy:  Appt start time: 1615 end time:  1715.  Assessment:  Primary concerns today: patient here for diabetes education. 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 He is on his feet most of the day, does not exercise currently outside of work. He and his wife have discussed waling and increasing to some jogging when he gets home from work. He states he checks his BG almost daily both in AM and after work alternately.  MEDICATIONS: see list. Diabetes medication is Metformin   DIETARY INTAKE:  Usual eating pattern includes 2-3 meals and 1-2 snacks per day.  Everyday foods include variety of most food groups.  Avoided foods include : none stated.    24-hr recall:  B ( AM): skips but plans to start: eggs, sausage, toast or cereal with flaxseed, 2% milk Snk ( AM): vegetables or fruit  L ( PM): eating out lately: hamburger and hot dogs, no fries. water Snk ( PM): before dinner; chips ans salsa D ( PM): wife prepares:meat, starch, vegetable, bread or potato, occasional salad, water to drink Snk ( PM): cereal with 2 % milk Beverages: water, milk  Usual physical activity: active at work - walking  Estimated energy needs: 2500 calories 280 g carbohydrates 185 g protein 70 g fat  Progress Towards Goal(s):  In progress.   Nutritional Diagnosis:  NB-1.1 Food and nutrition-related knowledge deficit As related to Diabetes.  As evidenced by A1c of 8.1%.    Intervention:  Nutrition counseling and diabetes education initiated. Discussed basic physiology of diabetes, SMBG and rationale of checking BG at alternate times of day, A1c, Carb Counting and reading food labels, and benefits of increased activity.   Plan: Aim for 5 Carb choices (75 grams) per meal +/- 1 either way Aim for 0-2 Carb Choices per snack if hungry or before exercising Consider walking after work with wife on a daily basis and increase as able Continue  checking BG as directed, consider alternate times and after exercise Consider reading food labels for total carb of foods Consider using Calorie [facility] APP for more nutrition information   Handouts given during visit include: Living Well with Diabetes Carb Counting and Food Label handouts Meal Plan Card Calorie [facility] APP info  Monitoring/Evaluation:  Dietary intake, exercise, reading food labels, and body weight prn.

## 2012-02-19 NOTE — Patient Instructions (Addendum)
Plan: Aim for 5 Carb choices (75 grams) per meal +/- 1 either way Aim for 0-2 Carb Choices per snack if hungry or before exercising Consider walking after work with wife on a daily basis and increase as able Continue checking BG as directed, consider alternate times and after exercise Consider reading food labels for total carb of foods Consider using Calorie [facility] APP for more nutrition information

## 2012-03-04 ENCOUNTER — Ambulatory Visit: Payer: 59 | Admitting: Family Medicine

## 2012-05-13 ENCOUNTER — Ambulatory Visit: Payer: 59 | Admitting: Family Medicine

## 2012-08-14 ENCOUNTER — Ambulatory Visit (INDEPENDENT_AMBULATORY_CARE_PROVIDER_SITE_OTHER): Payer: 59 | Admitting: Medical

## 2012-08-14 ENCOUNTER — Encounter: Payer: Self-pay | Admitting: Medical

## 2012-08-14 VITALS — BP 120/82 | HR 72 | Temp 98.0°F | Resp 16 | Wt 352.0 lb

## 2012-08-14 DIAGNOSIS — K219 Gastro-esophageal reflux disease without esophagitis: Secondary | ICD-10-CM

## 2012-08-14 DIAGNOSIS — R070 Pain in throat: Secondary | ICD-10-CM

## 2012-08-14 DIAGNOSIS — R221 Localized swelling, mass and lump, neck: Secondary | ICD-10-CM

## 2012-08-14 DIAGNOSIS — R220 Localized swelling, mass and lump, head: Secondary | ICD-10-CM

## 2012-08-14 NOTE — Progress Notes (Signed)
Subjective: Here for throat concern.  He notes that in part of his work environment it is very loud and dusty.   So he has to talk really loud, wears ear plugs.  Some times when working in that area gets sore throat.  He sings at work, sings in the car, does Retail banker.  Recently in the last year or so experimenting with singing.   Since Christmas feels like he has a lump in his throat, sore lump when swallows.  When stretches certain way or move neck a certain way feels it.   Denies dysphagia or hoarseness.  No fevers, night sweats, weight loss. No SOB, dyspnea, chest pain. He does have hx/o GERD, uses Prevacid daily.  daughter has a dog and this seems to aggravate his voice too.     Objective: Gen: wd, wn, nad Neck: supple, no obvious mass, no lymphadenopathy, no thyromegaly, no obvious abnormality of neck Lungs: clear Heart: RRR, normal S1, S2, no murmurs chest wall, axilla - no lumps, lymphadenopathy  Assessment: Encounter Diagnoses  Name Primary?  . Throat pain Yes  . Lump in throat   . GERD (gastroesophageal reflux disease)     Plan: C/t Prevacid, begin QHS Zyrtec OTC for possible allergen trigger, voice rest, warm fluids for throat symptoms.  If not improving in 2 wk, consider ENT referral and or imaging of neck.  No obvious nodule today.

## 2012-08-16 ENCOUNTER — Telehealth: Payer: Self-pay | Admitting: Family Medicine

## 2012-08-16 NOTE — Telephone Encounter (Signed)
Message copied by Janeice Shurley on Fri Aug 16, 2012  1:59 PM ------      Message from: Aleen Campi, DAVID S      Created: Wed Aug 14, 2012  8:58 PM       Regarding the prior chest CT, no need to repeat this currently.   Lets recheck in 2wk.  If no improvement, we will consider ENT referral vs imaging of the neck (CT or [place]).

## 2012-08-16 NOTE — Telephone Encounter (Signed)
Message copied by Janeice Crosby on Fri Aug 16, 2012  1:58 PM ------      Message from: Aleen Campi, DAVID S      Created: Wed Aug 14, 2012  8:58 PM       Regarding the prior chest CT, no need to repeat this currently.   Lets recheck in 2wk.  If no improvement, we will consider ENT referral vs imaging of the neck (CT or [place]).

## 2012-08-16 NOTE — Telephone Encounter (Signed)
Patient was made aware of what Mathew Covey PA-C recommend for his care. CLS

## 2012-08-27 ENCOUNTER — Ambulatory Visit (INDEPENDENT_AMBULATORY_CARE_PROVIDER_SITE_OTHER): Payer: 59 | Admitting: Family Medicine

## 2012-08-27 ENCOUNTER — Other Ambulatory Visit: Payer: Self-pay

## 2012-08-27 ENCOUNTER — Encounter: Payer: Self-pay | Admitting: Family Medicine

## 2012-08-27 VITALS — BP 122/82 | HR 104 | Temp 98.7°F | Wt 355.0 lb

## 2012-08-27 DIAGNOSIS — E119 Type 2 diabetes mellitus without complications: Secondary | ICD-10-CM

## 2012-08-27 DIAGNOSIS — E291 Testicular hypofunction: Secondary | ICD-10-CM

## 2012-08-27 DIAGNOSIS — E669 Obesity, unspecified: Secondary | ICD-10-CM

## 2012-08-27 DIAGNOSIS — J029 Acute pharyngitis, unspecified: Secondary | ICD-10-CM

## 2012-08-27 DIAGNOSIS — Z9119 Patient's noncompliance with other medical treatment and regimen: Secondary | ICD-10-CM

## 2012-08-27 LAB — POCT RAPID STREP A (OFFICE): Rapid Strep A Screen: NEGATIVE

## 2012-08-27 LAB — POCT GLYCOSYLATED HEMOGLOBIN (HGB A1C): Hemoglobin A1C: 10.5

## 2012-08-27 MED ORDER — TESTOSTERONE 20.25 MG/1.25GM (1.62%) TD GEL: 2.0000 "application " | TRANSDERMAL | Status: DC

## 2012-08-27 NOTE — Progress Notes (Signed)
  Subjective:    Patient ID: Mathew Ramsey, male    DOB: 10/14/1965, 47 y.o.   MRN: [id number]  HPI He has a three-day history of sore throat, PND, fatigue but no fever, chills, earache or cough. Review of his record indicates he has not been back for followup on his diabetes, hypogonadism or general medical care. When questioning him on this he cannot give me a good reason as to why. He has periodically check his blood sugars but has done nothing about it. He has not started an exercise or dietary program. He did note an improvement on testosterone but then stopped taking it.  Review of Systems     Objective:   Physical Exam alert and in no distress. Tympanic membranes and canals are normal. Throat is clear. Tonsils are normal. Neck is supple without adenopathy or thyromegaly. Cardiac exam shows a regular sinus rhythm without murmurs or gallops. Lungs are clear to auscultation. Strep Screen negative Hemoglobin A1c 10.5      Assessment & Plan:   1. Sore throat  POCT rapid strep A  2. Diabetes mellitus  HgB A1c  3. Hypogonadism male  Testosterone (ANDROGEL) 20.25 MG/1.25GM (1.62%) GEL  4. Obesity    5. Personal history of noncompliance with medical treatment, presenting hazards to health     recommend supportive care for the sore throat. Discussed diabetes care in regard to diet, exercise, general medical care. Told him that his lack of taking good care of himself was essentially slowly killing himself. Told him that I could not help him unless he helped himself first. Discussed starting testosterone again. Recheck here in one month.

## 2012-08-27 NOTE — Telephone Encounter (Signed)
CALLED IN ANDROGEL

## 2012-08-28 ENCOUNTER — Ambulatory Visit: Payer: 59 | Admitting: Medical

## 2012-09-30 ENCOUNTER — Encounter: Payer: Self-pay | Admitting: Family Medicine

## 2012-09-30 ENCOUNTER — Ambulatory Visit (INDEPENDENT_AMBULATORY_CARE_PROVIDER_SITE_OTHER): Payer: 59 | Admitting: Family Medicine

## 2012-09-30 VITALS — BP 126/82 | HR 91 | Wt 350.0 lb

## 2012-09-30 DIAGNOSIS — I428 Other cardiomyopathies: Secondary | ICD-10-CM

## 2012-09-30 DIAGNOSIS — E119 Type 2 diabetes mellitus without complications: Secondary | ICD-10-CM

## 2012-09-30 DIAGNOSIS — E291 Testicular hypofunction: Secondary | ICD-10-CM

## 2012-09-30 DIAGNOSIS — E669 Obesity, unspecified: Secondary | ICD-10-CM

## 2012-09-30 NOTE — Progress Notes (Signed)
Called [facility] to make apt. They wanted pt to call them and pt was advised

## 2012-09-30 NOTE — Progress Notes (Signed)
  Subjective:    Patient ID: Mathew Ramsey, male    DOB: 1965-08-02, 47 y.o.   MRN: [id number]  HPI He is here for a followup visit. He has made some dietary changes as well as increase his activities. He states that his blood sugars have slowly come down into the mid  100 range.he states that he is feeling better having more energy. He has not started back on testosterone citing concerns over possible side effects especially relating to his heart. Review of record indicates he was diagnosed with nonischemic cardiomyopathy and 2011 and he has not followed up since then. He has started to walk all of the more and has had no difficulty with chest pain, shortness of breath or DOE.   Review of Systems     Objective:   Physical Exam Alert and in no distress. Blood pressure and weight are recorded.       Assessment & Plan:  Obesity  Diabetes mellitus  Hypogonadism male  Nonischemic cardiomyopathy - Plan: Ambulatory referral to Cardiology discussed his overall health in the relationship between obesity, blood pressure, diabetes, cardiomyopathy and hypogonadism. Again strongly encouraged him to continue to make the diet and exercise changes. Our refer him back to cardiology. We discussed testosterone and at this time he would like to hold off and see what weight loss will do to help this.

## 2013-02-04 ENCOUNTER — Ambulatory Visit: Payer: 59 | Admitting: Family Medicine

## 2013-02-21 ENCOUNTER — Encounter: Payer: Self-pay | Admitting: Medical

## 2013-02-21 ENCOUNTER — Ambulatory Visit (INDEPENDENT_AMBULATORY_CARE_PROVIDER_SITE_OTHER): Payer: 59 | Admitting: Medical

## 2013-02-21 VITALS — BP 130/90 | HR 100 | Temp 98.1°F | Resp 18 | Wt 350.0 lb

## 2013-02-21 DIAGNOSIS — N509 Disorder of male genital organs, unspecified: Secondary | ICD-10-CM

## 2013-02-21 DIAGNOSIS — R102 Pelvic and perineal pain: Secondary | ICD-10-CM

## 2013-02-21 DIAGNOSIS — N5082 Scrotal pain: Secondary | ICD-10-CM

## 2013-02-21 LAB — POCT URINALYSIS DIPSTICK
Blood, UA: NEGATIVE
Nitrite, UA: NEGATIVE
Spec Grav, UA: 1.015
Urobilinogen, UA: NEGATIVE
pH, UA: 5

## 2013-02-21 NOTE — Progress Notes (Signed)
Subjective: Here for possible hernia in scrotum.  Been having full or heavy feeling in the scrotum.  Feels normal in the morning, but throughout the day gets heavy feeling in the legs and scrotum.  Lately right scrotum seems to have something in the scrotum.   Lying flat on back and raising hips seems to relieve this groin pain.  no other aggravating or relieving factors.  No other c/o.   Past Medical History  Diagnosis Date  . Obesity   . Diabetes mellitus   . Hypertension   . Allergy   . Dyslipidemia   . Sleep apnea   . Hyperlipidemia    Review of Systems Constitutional: -fever, -chills, -sweats, -unexpected weight change,-fatigue Cardiology:  -chest pain, -palpitations, -edema Respiratory: -cough, -shortness of breath, -wheezing Gastroenterology: -abdominal pain, -nausea, -vomiting, -diarrhea, -constipation  Hematology: -bleeding or bruising problems Musculoskeletal: -arthralgias, -myalgias, -joint swelling, -+occasionally low back pain Urology: +occasional tingle/burn at end of urine stream, otherwise -difficulty urinating, -hematuria, -urinary frequency, -urgency, - ejaculate change Neurology: -headache, -weakness, -tingling, -numbness    Objective: Filed Vitals:   02/21/13 1355  BP: 130/90  Pulse: 100  Temp: 98.1 F (36.7 C)  Resp: 18    General appearance: alert, no distress, WD/WN, obese AA male Back: nontender Abdomen: +bs, soft,obese, mild tenderness right inguinal region, but without obvious bulge or hernia, otherwise nontender, non distended, no masses, no hepatomegaly, no splenomegaly Pulses: 2+ symmetric ext: no edema GU: no obvious hernia, nontender, no mass, no warmth   Assessment: Encounter Diagnoses  Name Primary?  . Pelvic pain Yes  . Scrotal pain     Plan: Given body habitus, hard to say if hernia.  I don't feel an obvious hernia, but can't completely rule out.  Can't rule out prostatitis, but no other etiology seems more likely.  Gave options.   Will use trial of Cipro in the event this could be prostatitis.   If no improving in 1 wk, plan for CT.  Currently no major BPH symptoms either other than some urinary frequency.   No stream or ejaculate changes.  Will need prostate check next visit.

## 2013-02-27 ENCOUNTER — Ambulatory Visit: Payer: Self-pay | Admitting: Family Medicine

## 2013-03-06 ENCOUNTER — Ambulatory Visit (INDEPENDENT_AMBULATORY_CARE_PROVIDER_SITE_OTHER): Payer: 59 | Admitting: Family Medicine

## 2013-03-06 ENCOUNTER — Encounter: Payer: Self-pay | Admitting: Family Medicine

## 2013-03-06 VITALS — BP 130/90 | HR 100 | Wt 354.0 lb

## 2013-03-06 DIAGNOSIS — E119 Type 2 diabetes mellitus without complications: Secondary | ICD-10-CM

## 2013-03-06 DIAGNOSIS — E669 Obesity, unspecified: Secondary | ICD-10-CM

## 2013-03-06 DIAGNOSIS — E785 Hyperlipidemia, unspecified: Secondary | ICD-10-CM

## 2013-03-06 MED ORDER — CANAGLIFLOZIN 100 MG PO TABS: 1.0000 | ORAL_TABLET | ORAL | Status: DC

## 2013-03-06 MED ORDER — PIOGLITAZONE HCL-METFORMIN HCL 15-850 MG PO TABS: 1.0000 | ORAL_TABLET | Freq: Two times a day (BID) | ORAL | Status: DC

## 2013-03-06 NOTE — Progress Notes (Signed)
Subjective:    Mathew Ramsey is a 47 y.o. male who presents for follow-up of Type 2 diabetes mellitus.    Home blood sugar records: 140 TO 260  Current symptoms/problems :  BLURRY VISION / PAIN IN LEGS Daily foot checks, foot concerns: YES / NONE Last eye exam:  DR.GROAT    Medication compliance: Current diet: NONE/ CUT BACK ON SWEETS  Current exercise: WALKING AT WORK/ [employer]/WALK THE DOG Known diabetic complications: none Cardiovascular risk factors: diabetes mellitus, dyslipidemia, hypertension, male gender and obesity (BMI >= 30 kg/m2)   The following portions of the patient's history were reviewed and updated as appropriate: allergies, current medications, past family history, past medical history, past social history and problem list.  ROS as in subjective above    Objective:    Wt 354 lb (160.573 kg)  BMI 48.69 kg/m2  There were no vitals filed for this visit.  General appearence: alert, no distress, WD/WN Neck: supple, no lymphadenopathy, no thyromegaly, no masses Heart: RRR, normal S1, S2, no murmurs Lungs: CTA bilaterally, no wheezes, rhonchi, or rales Abdomen: +bs, soft, non tender, non distended, no masses, no hepatomegaly, no splenomegaly Pulses: 2+ symmetric, upper and lower extremities, normal cap refill Ext: no edema Foot exam:  Neuro: foot monofilament exam normal   Lab Review Lab Results  Component Value Date   HGBA1C 10.5 08/27/2012   Lab Results  Component Value Date   CHOL 219* 09/11/2011   HDL 52 09/11/2011   LDLCALC 139* 09/11/2011   TRIG 138 09/11/2011   CHOLHDL 4.2 09/11/2011   No results found for this basenameConcepcion Elk     Chemistry      Component Value Date/Time   NA 133* 01/25/2012 1120   K 3.9 01/25/2012 1120   CL 99 01/25/2012 1120   CO2 26 01/25/2012 1120   BUN 11 01/25/2012 1120   CREATININE 0.90 01/25/2012 1120   CREATININE 0.96 09/11/2011 0903      Component Value Date/Time   CALCIUM 9.0 01/25/2012 1120    ALKPHOS 75 01/25/2012 1120   AST 20 01/25/2012 1120   ALT 26 01/25/2012 1120   BILITOT 0.6 01/25/2012 1120        Chemistry      Component Value Date/Time   NA 133* 01/25/2012 1120   K 3.9 01/25/2012 1120   CL 99 01/25/2012 1120   CO2 26 01/25/2012 1120   BUN 11 01/25/2012 1120   CREATININE 0.90 01/25/2012 1120   CREATININE 0.96 09/11/2011 0903      Component Value Date/Time   CALCIUM 9.0 01/25/2012 1120   ALKPHOS 75 01/25/2012 1120   AST 20 01/25/2012 1120   ALT 26 01/25/2012 1120   BILITOT 0.6 01/25/2012 1120       Hemoglobin A1c 9.3    Assessment:  Diabetes mellitus - Plan: POCT glycosylated hemoglobin (Hb A1C), Canagliflozin (INVOKANA) 100 MG TABS, pioglitazone-metformin (ACTOPLUS MET) 15-850 MG per tablet  Dyslipidemia  Obesity         Plan:    1.  Rx changes: I will switch him to Actos plus and Invokana 2.  Education: Reviewed 'ABCs' of diabetes management (respective goals in parentheses):  A1C (<7), blood pressure (<130/80), and cholesterol (LDL <100). 3.  Compliance at present is estimated to be poor. Efforts to improve compliance (if necessary) will be directed at o. 4. Follow up: 4 months   Plans to get involved in a exercise and weight reduction program. I strongly encouraged him to do  this. Also had a long discussion with him concerning his lack of being able to make major lifestyle changes and risk to his health and also risk of cancer specifically colon cancer and prostate cancer due to this. Discussed the new medication regimen with him and possible side effects.

## 2013-03-06 NOTE — Patient Instructions (Signed)
Check your sugars regularly and why with a seizure sugars before meals in the low 100s and after meals below 200

## 2013-07-04 ENCOUNTER — Ambulatory Visit: Payer: 59 | Admitting: Family Medicine

## 2013-07-10 ENCOUNTER — Ambulatory Visit (INDEPENDENT_AMBULATORY_CARE_PROVIDER_SITE_OTHER): Payer: 59 | Admitting: Family Medicine

## 2013-07-10 ENCOUNTER — Encounter: Payer: Self-pay | Admitting: Family Medicine

## 2013-07-10 VITALS — BP 130/84 | HR 80 | Wt 350.0 lb

## 2013-07-10 DIAGNOSIS — E785 Hyperlipidemia, unspecified: Secondary | ICD-10-CM

## 2013-07-10 DIAGNOSIS — Z79899 Other long term (current) drug therapy: Secondary | ICD-10-CM

## 2013-07-10 DIAGNOSIS — E119 Type 2 diabetes mellitus without complications: Secondary | ICD-10-CM

## 2013-07-10 DIAGNOSIS — I10 Essential (primary) hypertension: Secondary | ICD-10-CM

## 2013-07-10 DIAGNOSIS — Z9119 Patient's noncompliance with other medical treatment and regimen: Secondary | ICD-10-CM

## 2013-07-10 DIAGNOSIS — R51 Headache: Secondary | ICD-10-CM

## 2013-07-10 DIAGNOSIS — E1159 Type 2 diabetes mellitus with other circulatory complications: Secondary | ICD-10-CM | POA: Insufficient documentation

## 2013-07-10 DIAGNOSIS — Z91199 Patient's noncompliance with other medical treatment and regimen due to unspecified reason: Secondary | ICD-10-CM

## 2013-07-10 DIAGNOSIS — E669 Obesity, unspecified: Secondary | ICD-10-CM

## 2013-07-10 DIAGNOSIS — E1169 Type 2 diabetes mellitus with other specified complication: Secondary | ICD-10-CM

## 2013-07-10 MED ORDER — SIMVASTATIN 20 MG PO TABS: 20.0000 mg | ORAL_TABLET | Freq: Every day | ORAL | Status: DC

## 2013-07-10 MED ORDER — PIOGLITAZONE HCL-METFORMIN HCL 15-850 MG PO TABS: 1.0000 | ORAL_TABLET | Freq: Two times a day (BID) | ORAL | Status: DC

## 2013-07-10 MED ORDER — CARVEDILOL 6.25 MG PO TABS: 6.2500 mg | ORAL_TABLET | Freq: Two times a day (BID) | ORAL | Status: DC

## 2013-07-10 MED ORDER — CANAGLIFLOZIN 100 MG PO TABS: 1.0000 | ORAL_TABLET | ORAL | Status: DC

## 2013-07-10 MED ORDER — BENAZEPRIL HCL 10 MG PO TABS: 10.0000 mg | ORAL_TABLET | Freq: Every day | ORAL | Status: DC

## 2013-07-10 NOTE — Progress Notes (Signed)
Subjective:    Mathew Ramsey is a 47 y.o. male who presents for follow-up of Type 2 diabetes mellitus.  Notes that he has been trying to spread out his metformin (taking 1 1000 mg tablet per day for the last 3 weeks), benazepril, carvedilol, simvastatin (3 weeks not taking these last three at all). He would also like to address some headaches that he has had since he was in high school. They are left-sided, throbbing and always precipitated by fasting. They can last anywhere from 4 hours to 2 days and they occur up to 4 x per month. They are not relieved by consuming food. His blood sugar is not measured to be low during these episodes. He has no photophobia, phonophobia, visual disturbance/aura, tearing, or nausea.   Home blood sugar records: fasting range: 220-250  Current symptoms/problems include polydipsia and polyuria and have  been worsening. Daily foot checks: Every other day or so when sitting on couch    Any foot concerns: none today Last eye exam:  1-2 years ago. No evidence of Diabetic Retinopathy    Medication compliance: Current diet: Frequent small quantities of junk food during the day, one large dinner. 2L of diet soda per day.  Current exercise: walking at work, will try to walk 30 min during lunch break 3-4 x per week. Known diabetic complications: none Cardiovascular risk factors: diabetes mellitus, dyslipidemia, male gender, obesity (BMI >= 30 kg/m2) and smoking/ tobacco exposure   The following portions of the patient's history were reviewed and updated as appropriate: allergies, current medications, past family history, past medical history, past social history, past surgical history and problem list.  ROS negative except as in subjective above.    Objective:    BP 130/84  Pulse 80  Wt 350 lb (158.759 kg)  SpO2 99%  Filed Vitals:   07/10/13 1611  BP: 130/84  Pulse: 80    General appearence: alert, no distress, WD/WN Neck: supple, no lymphadenopathy, no  thyromegaly, no masses Heart: RRR, normal S1, S2, no murmurs Lungs: CTA bilaterally, no wheezes, rhonchi, or rales Abdomen: +bs, soft, non tender, non distended, no masses, no hepatomegaly, no splenomegaly Ext: no edema    Lab Review Lab Results  Component Value Date   HGBA1C 9.3 03/06/2013   Lab Results  Component Value Date   CHOL 219* 09/11/2011   HDL 52 09/11/2011   LDLCALC 139* 09/11/2011   TRIG 138 09/11/2011   CHOLHDL 4.2 09/11/2011   No results found for this basenameConcepcion Elk     Chemistry      Component Value Date/Time   NA 133* 01/25/2012 1120   K 3.9 01/25/2012 1120   CL 99 01/25/2012 1120   CO2 26 01/25/2012 1120   BUN 11 01/25/2012 1120   CREATININE 0.90 01/25/2012 1120   CREATININE 0.96 09/11/2011 0903      Component Value Date/Time   CALCIUM 9.0 01/25/2012 1120   ALKPHOS 75 01/25/2012 1120   AST 20 01/25/2012 1120   ALT 26 01/25/2012 1120   BILITOT 0.6 01/25/2012 1120        Chemistry      Component Value Date/Time   NA 133* 01/25/2012 1120   K 3.9 01/25/2012 1120   CL 99 01/25/2012 1120   CO2 26 01/25/2012 1120   BUN 11 01/25/2012 1120   CREATININE 0.90 01/25/2012 1120   CREATININE 0.96 09/11/2011 0903      Component Value Date/Time   CALCIUM 9.0 01/25/2012 1120  ALKPHOS 75 01/25/2012 1120   AST 20 01/25/2012 1120   ALT 26 01/25/2012 1120   BILITOT 0.6 01/25/2012 1120    Hemoglobin A1c 9.9    Assessment/Plan:   1. Diabetes mellitus Reviewed 'ABCs' of diabetes management (respective goals in parentheses):  A1C (<7), blood pressure (<130/80), and cholesterol (LDL <100). Stressed that he should call our office should he be running out of his medications again before his visits, encouraged Mr. Bhatnagar to eat more frequent and smaller meals rich in vegetables during the day.  - Will obtain POCT glycosylated hemoglobin (Hb A1C) today  2. Headaches Will plan to eat more frequent and smaller meals. Recognizes that his trigger is fasting. Continue with  NSAID of choice.   3. Hypertension Will continue his benazepril, carvedilol  4. Dyslipidemia Continuing on his simvastatin 5 Noncompliance I had a very strong conversation with him concerning the fact that he is very noncompliant with taking care of himself. Explained that he needs to eat more regular but smaller meals. Continue with increased physical activity. Take medications as directed and if he runs out, he is to call. Explained that I would not let him run out of the medications. He has yet to take responsibility for his healthcare.

## 2013-09-16 ENCOUNTER — Telehealth: Payer: Self-pay | Admitting: Family Medicine

## 2013-09-16 DIAGNOSIS — E1169 Type 2 diabetes mellitus with other specified complication: Secondary | ICD-10-CM

## 2013-09-16 DIAGNOSIS — E119 Type 2 diabetes mellitus without complications: Secondary | ICD-10-CM

## 2013-09-16 DIAGNOSIS — E1159 Type 2 diabetes mellitus with other circulatory complications: Secondary | ICD-10-CM

## 2013-09-16 DIAGNOSIS — I10 Essential (primary) hypertension: Principal | ICD-10-CM

## 2013-09-16 DIAGNOSIS — E785 Hyperlipidemia, unspecified: Secondary | ICD-10-CM

## 2013-09-16 NOTE — Telephone Encounter (Signed)
Pt's [facility] [address] and [facility]. Please change all [facility]s that were [facility].

## 2013-09-17 MED ORDER — PIOGLITAZONE HCL-METFORMIN HCL 15-850 MG PO TABS: 1.0000 | ORAL_TABLET | Freq: Two times a day (BID) | ORAL | Status: DC

## 2013-09-17 MED ORDER — CANAGLIFLOZIN 100 MG PO TABS: 1.0000 | ORAL_TABLET | ORAL | Status: DC

## 2013-09-17 MED ORDER — CARVEDILOL 6.25 MG PO TABS
6.2500 mg | ORAL_TABLET | Freq: Two times a day (BID) | ORAL | Status: DC
Start: 2013-09-17 — End: 2014-10-15

## 2013-09-17 MED ORDER — SIMVASTATIN 20 MG PO TABS: 20.0000 mg | ORAL_TABLET | Freq: Every day | ORAL | Status: DC

## 2013-09-17 MED ORDER — BENAZEPRIL HCL 10 MG PO TABS: 10.0000 mg | ORAL_TABLET | Freq: Every day | ORAL | Status: DC

## 2013-09-17 NOTE — Telephone Encounter (Signed)
done

## 2013-10-30 ENCOUNTER — Telehealth: Payer: Self-pay | Admitting: Family Medicine

## 2013-10-30 MED ORDER — FEXOFENADINE-PSEUDOEPHED ER 180-240 MG PO TB24: 1.0000 | ORAL_TABLET | Freq: Every day | ORAL | Status: DC

## 2013-10-30 NOTE — Telephone Encounter (Signed)
Pt called and states [facility] will not allow him to buy Allegra D over the counter he said they told him their computer states they cannot sell it right now.  Pt states he has not bought any this year.  He did not have time to discuss it with the pharmacist as he had class.  He said they did the same thing to him last year.  He wants to know if you will call it in to [facility] on [address].  Pt ph [phone]

## 2013-11-10 ENCOUNTER — Ambulatory Visit: Payer: 59 | Admitting: Family Medicine

## 2013-11-24 ENCOUNTER — Encounter: Payer: Self-pay | Admitting: Family Medicine

## 2014-01-13 ENCOUNTER — Ambulatory Visit (INDEPENDENT_AMBULATORY_CARE_PROVIDER_SITE_OTHER): Payer: 59 | Admitting: Medical

## 2014-01-13 ENCOUNTER — Encounter: Payer: Self-pay | Admitting: Medical

## 2014-01-13 VITALS — BP 122/80 | HR 60 | Temp 98.3°F | Resp 16 | Wt 358.0 lb

## 2014-01-13 DIAGNOSIS — R079 Chest pain, unspecified: Secondary | ICD-10-CM

## 2014-01-13 DIAGNOSIS — R12 Heartburn: Secondary | ICD-10-CM

## 2014-01-13 DIAGNOSIS — K219 Gastro-esophageal reflux disease without esophagitis: Secondary | ICD-10-CM

## 2014-01-13 MED ORDER — DEXLANSOPRAZOLE 60 MG PO CPDR: 60.0000 mg | DELAYED_RELEASE_CAPSULE | Freq: Every day | ORAL | Status: DC

## 2014-01-13 NOTE — Progress Notes (Signed)
Subjective: Patient is 48 year old presenting for reflux possibly due to a hiatal hernia, diagnosed more than 10 years ago following an EGD. Currently, the he reports crampy pain in his throat down through to the epigastric area. He describes the pain as a dull burning type sensation. Associated symptoms include sour brash, constant burping and reports discomfort in his stomach feeling like he even "sees it moving". He has noted that the pain occurs intermittently and randomly and is not necessarily associated with meals or laying down. The patient also cannot pinpoint a particular food that exacerbates his pain. He is trying to not eat within two hours of sleeping but does not always adhere to this rule. Patient is currently taking Prevacid which has not helped as much recently for the last 3 months. Previously, after he took Prevacid, his pain would completely resolve.  Sometimes he gets chest pain, sometimes jaw and arm pain, attributes this to the acid but not sure.  No particularly SOB, but walking up the hill to his house can get him out of breath.  He has no other aggravating or relieving factors.  Mr. Cryer reports that he quit smoking 3 years ago and occasionally has as many as 2 drinks per week, mostly beer.  ROS as in subjective   Objective BP 122/80  Pulse 60  Temp(Src) 98.3 F (36.8 C) (Oral)  Resp 16  Wt 358 lb (162.388 kg)  General appearance: alert, no distress, WD/WN, obese AA male Oral cavity: MMM, no lesions Neck: supple, no lymphadenopathy, no thyromegaly, no masses, no JVD Heart: RRR, normal S1, S2, no murmurs Lungs: CTA bilaterally, no wheezes, rhonchi, or rales Abdomen: +bs, soft, non tender, non distended, no masses, no hepatomegaly, no splenomegaly Pulses: 2+ symmetric, upper and lower extremities, normal cap refill Ext: no edema    Adult ECG Report  Indication: chest pain, indigestion  Rate: 85 bpm  Rhythm: normal sinus rhythm  QRS Axis: 13 degrees  PR  Interval: 125ms  QRS Duration: 13ms  QTc: 454ms  Conduction Disturbances: none  Other Abnormalities: anterior infarct, age undetermined, but not new  Patient's cardiac risk factors are: diabetes mellitus, hypertension, male gender and obesity (BMI >= 30 kg/m2).  EKG comparison: compared to 01/2012, no new change  Narrative Interpretation: no acute changes     Assessment: Encounter Diagnoses  Name Primary?  . Heartburn Yes  . Chest pain   . GERD (gastroesophageal reflux disease)      Plan: Discussed symptoms, possible causes.  Not particularly cardiac sounding, more GERD sounding, but advised the possibly of more than one process.  No EKG changes.  Begin Dexilant, begin OTC Zantac BID, tums prn, limit/avoid triggers of GERD, cut back on ETOH, f/u 2wk.   If worse chest pain, parituclarly with activity and associated jaw/arm pain, sob/sweats, then return or call 911.    If no worse chest pain, but no improvement on GERD in 2wk, refer to GI  Needs soon f/u on routine diabetic care.  reviewed EKG.

## 2014-02-06 ENCOUNTER — Ambulatory Visit (INDEPENDENT_AMBULATORY_CARE_PROVIDER_SITE_OTHER): Payer: 59 | Admitting: Family Medicine

## 2014-02-06 VITALS — BP 120/74 | HR 90 | Wt 354.0 lb

## 2014-02-06 DIAGNOSIS — I10 Essential (primary) hypertension: Secondary | ICD-10-CM

## 2014-02-06 DIAGNOSIS — E785 Hyperlipidemia, unspecified: Secondary | ICD-10-CM

## 2014-02-06 DIAGNOSIS — N528 Other male erectile dysfunction: Secondary | ICD-10-CM

## 2014-02-06 DIAGNOSIS — E1159 Type 2 diabetes mellitus with other circulatory complications: Secondary | ICD-10-CM

## 2014-02-06 DIAGNOSIS — E291 Testicular hypofunction: Secondary | ICD-10-CM

## 2014-02-06 DIAGNOSIS — E119 Type 2 diabetes mellitus without complications: Secondary | ICD-10-CM

## 2014-02-06 DIAGNOSIS — N529 Male erectile dysfunction, unspecified: Secondary | ICD-10-CM

## 2014-02-06 DIAGNOSIS — K219 Gastro-esophageal reflux disease without esophagitis: Secondary | ICD-10-CM

## 2014-02-06 DIAGNOSIS — E1169 Type 2 diabetes mellitus with other specified complication: Secondary | ICD-10-CM

## 2014-02-06 DIAGNOSIS — E669 Obesity, unspecified: Secondary | ICD-10-CM

## 2014-02-06 LAB — CBC WITH DIFFERENTIAL/PLATELET
Basophils Absolute: 0 10*3/uL (ref 0.0–0.1)
Basophils Relative: 0 % (ref 0–1)
EOS ABS: 0.1 10*3/uL (ref 0.0–0.7)
Eosinophils Relative: 1 % (ref 0–5)
HCT: 44.7 % (ref 39.0–52.0)
Hemoglobin: 15.4 g/dL (ref 13.0–17.0)
LYMPHS ABS: 2.2 10*3/uL (ref 0.7–4.0)
Lymphocytes Relative: 44 % (ref 12–46)
MCH: 28.4 pg (ref 26.0–34.0)
MCHC: 34.5 g/dL (ref 30.0–36.0)
MCV: 82.3 fL (ref 78.0–100.0)
Monocytes Absolute: 0.4 10*3/uL (ref 0.1–1.0)
Monocytes Relative: 7 % (ref 3–12)
NEUTROS PCT: 48 % (ref 43–77)
Neutro Abs: 2.4 10*3/uL (ref 1.7–7.7)
PLATELETS: 132 10*3/uL — AB (ref 150–400)
RBC: 5.43 MIL/uL (ref 4.22–5.81)
RDW: 14.5 % (ref 11.5–15.5)
WBC: 5.1 10*3/uL (ref 4.0–10.5)

## 2014-02-06 LAB — COMPREHENSIVE METABOLIC PANEL
ALT: 19 U/L (ref 0–53)
AST: 15 U/L (ref 0–37)
Albumin: 4.4 g/dL (ref 3.5–5.2)
Alkaline Phosphatase: 71 U/L (ref 39–117)
BILIRUBIN TOTAL: 0.7 mg/dL (ref 0.2–1.2)
BUN: 18 mg/dL (ref 6–23)
CO2: 25 mEq/L (ref 19–32)
Calcium: 9.4 mg/dL (ref 8.4–10.5)
Chloride: 101 mEq/L (ref 96–112)
Creat: 1.09 mg/dL (ref 0.50–1.35)
Glucose, Bld: 150 mg/dL — ABNORMAL HIGH (ref 70–99)
Potassium: 4.1 mEq/L (ref 3.5–5.3)
SODIUM: 136 meq/L (ref 135–145)
Total Protein: 7.1 g/dL (ref 6.0–8.3)

## 2014-02-06 LAB — LIPID PANEL
Cholesterol: 207 mg/dL — ABNORMAL HIGH (ref 0–200)
HDL: 55 mg/dL (ref >39)
LDL Cholesterol: 128 mg/dL — ABNORMAL HIGH (ref 0–99)
Total CHOL/HDL Ratio: 3.8 Ratio
Triglycerides: 119 mg/dL (ref <150)
VLDL: 24 mg/dL (ref 0–40)

## 2014-02-06 LAB — POCT UA - MICROALBUMIN
ALBUMIN/CREATININE RATIO, URINE, POC: 7.9
Creatinine, POC: 220.6 mg/dL
Microalbumin Ur, POC: 17.5 mg/L

## 2014-02-06 LAB — POCT GLYCOSYLATED HEMOGLOBIN (HGB A1C): HEMOGLOBIN A1C: 8.8

## 2014-02-06 MED ORDER — DAPAGLIFLOZIN PROPANEDIOL 5 MG PO TABS: 1.0000 | ORAL_TABLET | Freq: Every day | ORAL | Status: DC

## 2014-02-06 MED ORDER — VARDENAFIL HCL 10 MG PO TABS: 10.0000 mg | ORAL_TABLET | Freq: Every day | ORAL | Status: DC | PRN

## 2014-02-06 NOTE — Progress Notes (Signed)
Subjective:    Mathew Ramsey is a 48 y.o. male who presents for follow-up of Type 2 diabetes mellitus.  He stopped taking his Invokana due to unacceptable side effects. He also is had some erectile dysfunction issues and would like medication for this. He states that he has made some dietary as well as exercise changes. He also complains of reflux and has used Prevacid but only 15 mg dosing.  Home blood sugar records: OUT STRIPS  Current symptoms/probl NONE Daily foot checks:   Any foot concerns: YES/ THICK TOE NAILS  Last eye exam:  DR.GROAT LONG TIME AGO    Medication compliance: Current diet: LOW CARB/ NO SWEETS  Current exercise: WALKING THE DOG 4 DAYS A WEEK 30 MIN AT A TIME Known diabetic complications: impotence Cardiovascular risk factors: dyslipidemia, hypertension, male gender and obesity (BMI >= 30 kg/m2)   The following portions of the patient's history were reviewed and updated as appropriate: allergies, current medications, past family history, past medical history, past social history and problem list.  ROS as in subjective above    Objective:   General appearence: alert, no distress, WD/WN   Lab Review Lab Results  Component Value Date   HGBA1C 9.9 07/10/2013   Lab Results  Component Value Date   CHOL 219* 09/11/2011   HDL 52 09/11/2011   LDLCALC 139* 09/11/2011   TRIG 138 09/11/2011   CHOLHDL 4.2 09/11/2011   No results found for this basenameDerl Barrow     Chemistry      Component Value Date/Time   NA 133* 01/25/2012 1120   K 3.9 01/25/2012 1120   CL 99 01/25/2012 1120   CO2 26 01/25/2012 1120   BUN 11 01/25/2012 1120   CREATININE 0.90 01/25/2012 1120   CREATININE 0.96 09/11/2011 0903      Component Value Date/Time   CALCIUM 9.0 01/25/2012 1120   ALKPHOS 75 01/25/2012 1120   AST 20 01/25/2012 1120   ALT 26 01/25/2012 1120   BILITOT 0.6 01/25/2012 1120        Chemistry      Component Value Date/Time   NA 133* 01/25/2012 1120   K 3.9  01/25/2012 1120   CL 99 01/25/2012 1120   CO2 26 01/25/2012 1120   BUN 11 01/25/2012 1120   CREATININE 0.90 01/25/2012 1120   CREATININE 0.96 09/11/2011 0903      Component Value Date/Time   CALCIUM 9.0 01/25/2012 1120   ALKPHOS 75 01/25/2012 1120   AST 20 01/25/2012 1120   ALT 26 01/25/2012 1120   BILITOT 0.6 01/25/2012 1120     Hemoglobin A1c is 8.8    Assessment:  Obesity  Hypogonadism male  Hypertension associated with diabetes - Plan: CBC with Differential, Comprehensive metabolic panel  DM type 2 with diabetic dyslipidemia - Plan: Lipid panel, POCT UA - Microalbumin  Type 2 diabetes mellitus without complication - Plan: POCT glycosylated hemoglobin (Hb A1C), Dapagliflozin Propanediol (FARXIGA) 5 MG TABS, Amb Referral to Nutrition and Diabetic E, CBC with Differential, Comprehensive metabolic panel  Gastroesophageal reflux disease without esophagitis  Other male erectile dysfunction - Plan: vardenafil (LEVITRA) 10 MG tablet        Plan:    1.  Rx changes: A sample of farsiga was given he was also given Levitra 2.  Education: Reviewed 'ABCs' of diabetes management (respective goals in parentheses):  A1C (<7), blood pressure (<130/80), and cholesterol (LDL <100). 3.  Compliance at present is estimated to be fair. Efforts to  improve compliance (if necessary) will be directed at increased exercise. 4. Follow up: 4 months  Seems as if he has made some diet and exercise changes. Encouraged him to continue to do this. Discussed the use of Levitra and possible side effects. Command he increase his Prevacid to 30 mg and use it on an as-needed basis for his reflux symptoms.

## 2014-02-06 NOTE — Patient Instructions (Signed)
Take the 30 mg Prevacid until your symptoms are under control and then back off to the other day and then it recurred today to keep them under control

## 2014-02-07 MED ORDER — ROSUVASTATIN CALCIUM 10 MG PO TABS: 10.0000 mg | ORAL_TABLET | Freq: Every day | ORAL | Status: DC

## 2014-02-07 NOTE — Addendum Note (Signed)
Addended by: Denita Lung on: 02/07/2014 07:53 AM   Modules accepted: Orders

## 2014-02-09 ENCOUNTER — Other Ambulatory Visit: Payer: Self-pay

## 2014-02-09 MED ORDER — ROSUVASTATIN CALCIUM 10 MG PO TABS: 10.0000 mg | ORAL_TABLET | Freq: Every day | ORAL | Status: DC

## 2014-02-11 ENCOUNTER — Telehealth: Payer: Self-pay | Admitting: Family Medicine

## 2014-02-11 ENCOUNTER — Telehealth: Payer: Self-pay | Admitting: Internal Medicine

## 2014-02-11 MED ORDER — ATORVASTATIN CALCIUM 20 MG PO TABS: 20.0000 mg | ORAL_TABLET | Freq: Every day | ORAL | Status: DC

## 2014-02-11 MED ORDER — SILDENAFIL CITRATE 100 MG PO TABS: 100.0000 mg | ORAL_TABLET | Freq: Every day | ORAL | Status: DC | PRN

## 2014-02-11 NOTE — Telephone Encounter (Signed)
P.A. For [employer] will not cover this but will cover simvastatin and Atorvastatin. Pt has already tried Simvastatin so PER JCL- pt is to go on Atorvastatin 20mg  once daily. I have sent in  atorvastatin 20mg  to pharmacy. I left a message for pt to call me back so i can explain to him.   Case ID- [id number]

## 2014-02-11 NOTE — Telephone Encounter (Signed)
He states that Levitra was not effective. I will switch him to Viagra. I will also continue him on Farxiga.

## 2014-02-11 NOTE — Telephone Encounter (Signed)
Patient called back about samples for diabetes and levitra  Diabetic samples did not give him any side effects, no heasaches , no nausea   Doesn't fee like [place] worked, would like to try "little blue pill"  Please call

## 2014-02-23 LAB — HM DIABETES EYE EXAM

## 2014-02-26 ENCOUNTER — Encounter: Payer: Self-pay | Admitting: Internal Medicine

## 2014-06-11 ENCOUNTER — Ambulatory Visit: Payer: 59 | Admitting: Family Medicine

## 2014-09-07 ENCOUNTER — Ambulatory Visit (INDEPENDENT_AMBULATORY_CARE_PROVIDER_SITE_OTHER): Payer: 59 | Admitting: Family Medicine

## 2014-09-07 ENCOUNTER — Encounter: Payer: Self-pay | Admitting: Family Medicine

## 2014-09-07 VITALS — BP 130/80 | Wt 346.0 lb

## 2014-09-07 DIAGNOSIS — M79674 Pain in right toe(s): Secondary | ICD-10-CM

## 2014-09-07 NOTE — Patient Instructions (Signed)
Set up a diabetes recheck appointment

## 2014-09-07 NOTE — Progress Notes (Signed)
   Subjective:    Patient ID: Mathew Ramsey, male    DOB: 09/18/1965, 49 y.o.   MRN: [id number]  HPI He is here for evaluation of right toe pain and possible ingrown nail. He states that today it is feeling slightly better.   Review of Systems     Objective:   Physical Exam Exam of the right great toe does show no erythema or tenderness to palpation. The nail is growing out appropriately.       Assessment & Plan:  Great toe pain, right  recommend he continue to trim his toenails straight across. Also encouraged him to return for more thorough examination in general medical check concerning his underlying diabetes.

## 2014-09-24 ENCOUNTER — Encounter: Payer: 59 | Admitting: Family Medicine

## 2014-10-13 ENCOUNTER — Ambulatory Visit: Payer: 59 | Admitting: Family Medicine

## 2014-10-15 ENCOUNTER — Encounter: Payer: Self-pay | Admitting: Family Medicine

## 2014-10-15 ENCOUNTER — Ambulatory Visit (INDEPENDENT_AMBULATORY_CARE_PROVIDER_SITE_OTHER): Payer: 59 | Admitting: Family Medicine

## 2014-10-15 VITALS — BP 140/96 | HR 94 | Ht 72.5 in | Wt 371.6 lb

## 2014-10-15 DIAGNOSIS — G4730 Sleep apnea, unspecified: Secondary | ICD-10-CM

## 2014-10-15 DIAGNOSIS — E119 Type 2 diabetes mellitus without complications: Secondary | ICD-10-CM

## 2014-10-15 DIAGNOSIS — E785 Hyperlipidemia, unspecified: Secondary | ICD-10-CM | POA: Diagnosis not present

## 2014-10-15 DIAGNOSIS — E669 Obesity, unspecified: Secondary | ICD-10-CM

## 2014-10-15 DIAGNOSIS — E1159 Type 2 diabetes mellitus with other circulatory complications: Secondary | ICD-10-CM

## 2014-10-15 DIAGNOSIS — I10 Essential (primary) hypertension: Secondary | ICD-10-CM

## 2014-10-15 DIAGNOSIS — Z91199 Patient's noncompliance with other medical treatment and regimen due to unspecified reason: Secondary | ICD-10-CM

## 2014-10-15 DIAGNOSIS — E1169 Type 2 diabetes mellitus with other specified complication: Secondary | ICD-10-CM

## 2014-10-15 DIAGNOSIS — Z0000 Encounter for general adult medical examination without abnormal findings: Secondary | ICD-10-CM

## 2014-10-15 DIAGNOSIS — Z9119 Patient's noncompliance with other medical treatment and regimen: Secondary | ICD-10-CM | POA: Diagnosis not present

## 2014-10-15 DIAGNOSIS — E291 Testicular hypofunction: Secondary | ICD-10-CM

## 2014-10-15 LAB — POCT UA - MICROALBUMIN
Albumin/Creatinine Ratio, Urine, POC: 23
CREATININE, POC: 178.5 mg/dL
Microalbumin Ur, POC: 41 mg/L

## 2014-10-15 LAB — COMPREHENSIVE METABOLIC PANEL
ALT: 31 U/L (ref 0–53)
AST: 21 U/L (ref 0–37)
Albumin: 4.2 g/dL (ref 3.5–5.2)
Alkaline Phosphatase: 79 U/L (ref 39–117)
BUN: 10 mg/dL (ref 6–23)
CALCIUM: 9.2 mg/dL (ref 8.4–10.5)
CO2: 24 mEq/L (ref 19–32)
CREATININE: 0.96 mg/dL (ref 0.50–1.35)
Chloride: 100 mEq/L (ref 96–112)
Glucose, Bld: 265 mg/dL — ABNORMAL HIGH (ref 70–99)
Potassium: 4.3 mEq/L (ref 3.5–5.3)
SODIUM: 135 meq/L (ref 135–145)
Total Bilirubin: 1 mg/dL (ref 0.2–1.2)
Total Protein: 7 g/dL (ref 6.0–8.3)

## 2014-10-15 LAB — LIPID PANEL
CHOLESTEROL: 237 mg/dL — AB (ref 0–200)
HDL: 53 mg/dL (ref >=40)
LDL Cholesterol: 158 mg/dL — ABNORMAL HIGH (ref 0–99)
Total CHOL/HDL Ratio: 4.5 Ratio
Triglycerides: 129 mg/dL (ref <150)
VLDL: 26 mg/dL (ref 0–40)

## 2014-10-15 LAB — POCT GLYCOSYLATED HEMOGLOBIN (HGB A1C): Hemoglobin A1C: 11.1

## 2014-10-15 MED ORDER — BENAZEPRIL HCL 10 MG PO TABS: 10.0000 mg | ORAL_TABLET | Freq: Every day | ORAL | Status: DC

## 2014-10-15 MED ORDER — PIOGLITAZONE HCL-METFORMIN HCL 15-850 MG PO TABS: 1.0000 | ORAL_TABLET | Freq: Two times a day (BID) | ORAL | Status: DC

## 2014-10-15 MED ORDER — ROSUVASTATIN CALCIUM 10 MG PO TABS: 10.0000 mg | ORAL_TABLET | Freq: Every day | ORAL | Status: DC

## 2014-10-15 MED ORDER — CARVEDILOL 6.25 MG PO TABS: 6.2500 mg | ORAL_TABLET | Freq: Two times a day (BID) | ORAL | Status: DC

## 2014-10-15 NOTE — Progress Notes (Signed)
Subjective:    Patient ID: Mathew Ramsey, male    DOB: 07/03/1966, 49 y.o.   MRN: [id number]  HPI He is here for complete examination. He ran out of insurance several months ago. He has not been on any medications recently. He is now back in school. He admits to being totally deconditioned. He states that he was involved in a Kinesiology project of walking for a mile and half and had trouble walking only 1 mile.He also continues have difficulty with reflux symptoms. He does have sleep apnea but has not been using his machine stating it is uncomfortable. He has not been on testosterone or any of his other medications. He has been out of test strips and will need to have them called in. Family and social history as well as immunizations were reviewed.   Review of Systems  All other systems reviewed and are negative.      Objective:   Physical Exam BP 140/96 mmHg  Pulse 94  Ht 6' 0.5" (1.842 m)  Wt 371 lb 9.6 oz (168.557 kg)  BMI 49.68 kg/m2  SpO2 97%  General Appearance:    Alert, cooperative, no distress, appears stated age  Head:    Normocephalic, without obvious abnormality, atraumatic  Eyes:    PERRL, conjunctiva/corneas clear, EOM's intact, fundi    benign  Ears:    Normal TM's and external ear canals  Nose:   Nares normal, mucosa normal, no drainage or sinus   tenderness  Throat:   Lips, mucosa, and tongue normal; teeth and gums normal  Neck:   Supple, no lymphadenopathy;  thyroid:  no   enlargement/tenderness/nodules; no carotid   bruit or JVD  Back:    Spine nontender, no curvature, ROM normal, no CVA     tenderness  Lungs:     Clear to auscultation bilaterally without wheezes, rales or     ronchi; respirations unlabored  Chest Wall:    No tenderness or deformity   Heart:    Regular rate and rhythm, S1 and S2 normal, no murmur, rub   or gallop  Breast Exam:    No chest wall tenderness, masses or gynecomastia  Abdomen:     Soft, non-tender, nondistended, normoactive  bowel sounds,    no masses, no hepatosplenomegaly        Extremities:   No clubbing, cyanosis or edema  Pulses:   2+ and symmetric all extremities  Skin:   Skin color, texture, turgor normal, no rashes or lesions  Lymph nodes:   Cervical, supraclavicular, and axillary nodes normal  Neurologic:   CNII-XII intact, normal strength, sensation and gait; reflexes 2+ and symmetric throughout          Psych:   Normal mood, affect, hygiene and grooming.    Hemoglobin A1c is 11.1     Assessment & Plan:  Routine general medical examination at a health care facility - Plan: CBC with Differential/Platelet, Comprehensive metabolic panel, Lipid panel  Sleep apnea  Obesity - Plan: CBC with Differential/Platelet, Comprehensive metabolic panel, Lipid panel  Hypogonadism male - Plan: PSA, Testosterone  Hypertension associated with diabetes - Plan: CBC with Differential/Platelet, Comprehensive metabolic panel, benazepril (LOTENSIN) 10 MG tablet, carvedilol (COREG) 6.25 MG tablet  DM type 2 with diabetic dyslipidemia  Type 2 diabetes mellitus without complication - Plan: POCT glycosylated hemoglobin (Hb A1C), CBC with Differential/Platelet, Comprehensive metabolic panel, Lipid panel, POCT UA - Microalbumin, Ambulatory referral to Ophthalmology, pioglitazone-metformin (ACTOPLUS MET) 15-850 MG per tablet  Personal  history of noncompliance with medical treatment, presenting hazards to health  Hyperlipidemia associated with type 2 diabetes mellitus - Plan: rosuvastatin (CRESTOR) 10 MG tablet He has been totally noncompliant with his overall care. He recognizes that this is not healthy but has not made choices to change this. He is not interested in pursuing changing the CPAP. I will place him back on all of his other medications. Stressed to him the need for him to take better care of himself in regard to diet, exercise, medications. He will be set up to see Dr. Katy Fitch.Explained that his present lifestyle is  slowly shortening his life. Explained the risk of stroke, blindness, heart failure, kidney failure, nerve damage.

## 2014-10-16 LAB — CBC WITH DIFFERENTIAL/PLATELET
Basophils Absolute: 0 10*3/uL (ref 0.0–0.1)
Basophils Relative: 1 % (ref 0–1)
EOS PCT: 1 % (ref 0–5)
Eosinophils Absolute: 0 10*3/uL (ref 0.0–0.7)
HEMATOCRIT: 49.4 % (ref 39.0–52.0)
Hemoglobin: 16.6 g/dL (ref 13.0–17.0)
LYMPHS ABS: 1.5 10*3/uL (ref 0.7–4.0)
Lymphocytes Relative: 44 % (ref 12–46)
MCH: 28.2 pg (ref 26.0–34.0)
MCHC: 33.6 g/dL (ref 30.0–36.0)
MCV: 83.9 fL (ref 78.0–100.0)
MONO ABS: 0.3 10*3/uL (ref 0.1–1.0)
MONOS PCT: 8 % (ref 3–12)
NEUTROS PCT: 46 % (ref 43–77)
Neutro Abs: 1.6 10*3/uL — ABNORMAL LOW (ref 1.7–7.7)
Platelets: 143 10*3/uL — ABNORMAL LOW (ref 150–400)
RBC: 5.89 MIL/uL — ABNORMAL HIGH (ref 4.22–5.81)
RDW: 14.2 % (ref 11.5–15.5)
WBC: 3.5 10*3/uL — AB (ref 4.0–10.5)

## 2014-10-16 LAB — TESTOSTERONE: TESTOSTERONE: 183 ng/dL — AB (ref 300–890)

## 2014-10-16 LAB — PSA: PSA: 0.94 ng/mL (ref <=4.00)

## 2015-01-06 ENCOUNTER — Telehealth: Payer: Self-pay | Admitting: Family Medicine

## 2015-01-06 NOTE — Telephone Encounter (Signed)
Called pt. To pick up sample medications. He is aware the medications are ready for pick up LJ 6-8

## 2015-02-18 ENCOUNTER — Ambulatory Visit: Payer: 59 | Admitting: Family Medicine

## 2015-02-18 ENCOUNTER — Telehealth: Payer: Self-pay

## 2015-02-18 NOTE — Telephone Encounter (Signed)
Left message that he missed his appointment this morning at 10 am and he needs to call and reschedule

## 2015-03-02 ENCOUNTER — Encounter: Payer: Self-pay | Admitting: Family Medicine

## 2015-05-28 ENCOUNTER — Telehealth: Payer: Self-pay

## 2015-05-28 NOTE — Telephone Encounter (Signed)
Medical records sent out to [place] on 05/28/15

## 2015-08-09 ENCOUNTER — Other Ambulatory Visit: Payer: Self-pay | Admitting: Family Medicine

## 2015-08-09 DIAGNOSIS — M7989 Other specified soft tissue disorders: Secondary | ICD-10-CM

## 2015-08-09 DIAGNOSIS — M79604 Pain in right leg: Secondary | ICD-10-CM

## 2015-08-10 ENCOUNTER — Ambulatory Visit
Admission: RE | Admit: 2015-08-10 | Discharge: 2015-08-10 | Disposition: A | Discharge status: Home / Self Care | Payer: PRIVATE HEALTH INSURANCE | Source: Ambulatory Visit | Attending: Family Medicine | Admitting: Family Medicine

## 2015-08-10 DIAGNOSIS — M79604 Pain in right leg: Secondary | ICD-10-CM

## 2015-08-10 DIAGNOSIS — M7989 Other specified soft tissue disorders: Secondary | ICD-10-CM

## 2016-08-22 ENCOUNTER — Encounter (HOSPITAL_COMMUNITY): Payer: Self-pay | Admitting: Emergency Medicine

## 2016-08-22 ENCOUNTER — Emergency Department (HOSPITAL_COMMUNITY)
Admission: EM | Admit: 2016-08-22 | Discharge: 2016-08-22 | Disposition: A | Discharge status: Home / Self Care | Payer: PRIVATE HEALTH INSURANCE | Attending: Emergency Medicine | Admitting: Emergency Medicine

## 2016-08-22 ENCOUNTER — Emergency Department (HOSPITAL_COMMUNITY): Payer: PRIVATE HEALTH INSURANCE

## 2016-08-22 DIAGNOSIS — E119 Type 2 diabetes mellitus without complications: Secondary | ICD-10-CM | POA: Insufficient documentation

## 2016-08-22 DIAGNOSIS — Z79899 Other long term (current) drug therapy: Secondary | ICD-10-CM | POA: Insufficient documentation

## 2016-08-22 DIAGNOSIS — Z7984 Long term (current) use of oral hypoglycemic drugs: Secondary | ICD-10-CM | POA: Insufficient documentation

## 2016-08-22 DIAGNOSIS — Z5181 Encounter for therapeutic drug level monitoring: Secondary | ICD-10-CM | POA: Diagnosis not present

## 2016-08-22 DIAGNOSIS — R21 Rash and other nonspecific skin eruption: Secondary | ICD-10-CM | POA: Insufficient documentation

## 2016-08-22 DIAGNOSIS — R059 Cough, unspecified: Secondary | ICD-10-CM

## 2016-08-22 DIAGNOSIS — R05 Cough: Secondary | ICD-10-CM | POA: Diagnosis not present

## 2016-08-22 DIAGNOSIS — I10 Essential (primary) hypertension: Secondary | ICD-10-CM | POA: Diagnosis not present

## 2016-08-22 DIAGNOSIS — Z87891 Personal history of nicotine dependence: Secondary | ICD-10-CM | POA: Diagnosis not present

## 2016-08-22 LAB — BASIC METABOLIC PANEL
Anion gap: 10 (ref 5–15)
BUN: 11 mg/dL (ref 6–20)
CO2: 26 mmol/L (ref 22–32)
CREATININE: 1.01 mg/dL (ref 0.61–1.24)
Calcium: 9 mg/dL (ref 8.9–10.3)
Chloride: 97 mmol/L — ABNORMAL LOW (ref 101–111)
GFR calc Af Amer: >60 mL/min (ref >60)
GFR calc non Af Amer: >60 mL/min (ref >60)
GLUCOSE: 218 mg/dL — AB (ref 65–99)
Potassium: 4.4 mmol/L (ref 3.5–5.1)
SODIUM: 133 mmol/L — AB (ref 135–145)

## 2016-08-22 LAB — I-STAT TROPONIN, ED
TROPONIN I, POC: 0 ng/mL (ref 0.00–0.08)
Troponin i, poc: 0 ng/mL (ref 0.00–0.08)

## 2016-08-22 LAB — CBC
HCT: 43.6 % (ref 39.0–52.0)
Hemoglobin: 15.1 g/dL (ref 13.0–17.0)
MCH: 28.5 pg (ref 26.0–34.0)
MCHC: 34.6 g/dL (ref 30.0–36.0)
MCV: 82.4 fL (ref 78.0–100.0)
PLATELETS: 132 10*3/uL — AB (ref 150–400)
RBC: 5.29 MIL/uL (ref 4.22–5.81)
RDW: 13 % (ref 11.5–15.5)
WBC: 4.6 10*3/uL (ref 4.0–10.5)

## 2016-08-22 LAB — HEPATIC FUNCTION PANEL
ALT: 114 U/L — AB (ref 17–63)
AST: 32 U/L (ref 15–41)
Albumin: 3.7 g/dL (ref 3.5–5.0)
Alkaline Phosphatase: 135 U/L — ABNORMAL HIGH (ref 38–126)
BILIRUBIN DIRECT: 0.2 mg/dL (ref 0.1–0.5)
BILIRUBIN INDIRECT: 0.7 mg/dL (ref 0.3–0.9)
Total Bilirubin: 0.9 mg/dL (ref 0.3–1.2)
Total Protein: 7.2 g/dL (ref 6.5–8.1)

## 2016-08-22 LAB — PROTIME-INR
INR: 1.05
PROTHROMBIN TIME: 13.8 s (ref 11.4–15.2)

## 2016-08-22 LAB — APTT: aPTT: 28 seconds (ref 24–36)

## 2016-08-22 MED ORDER — ALBUTEROL SULFATE HFA 108 (90 BASE) MCG/ACT IN AERS
2.0000 | INHALATION_SPRAY | Freq: Once | RESPIRATORY_TRACT | Status: AC
  Administered 2016-08-22: 2 via RESPIRATORY_TRACT
  Filled 2016-08-22: qty 6.7

## 2016-08-22 MED ORDER — HYDROCODONE-HOMATROPINE 5-1.5 MG/5ML PO SYRP: 5.0000 mL | ORAL_SOLUTION | Freq: Four times a day (QID) | ORAL | 0 refills | Status: DC | PRN

## 2016-08-22 NOTE — Discharge Instructions (Signed)
Read the information below.  Use the prescribed medication as directed.  Please discuss all new medications with your pharmacist.  You may return to the Emergency Department at any time for worsening condition or any new symptoms that concern you.   If you develop worsening chest pain, shortness of breath, fever, you pass out, or become weak or dizzy, return to the ER for a recheck.    °

## 2016-08-22 NOTE — ED Triage Notes (Signed)
Pt states he noted lip and ear irritation.  Pt then noted a red rash (non itchy) on his bilateral legs.  Pt also c/o a tightness around his stomach.  Patient states these symptoms have slowly been starting since Saturday, but today patient awoke and was experiencing symptoms of fatigue, dizziness and light-headedness.  Pt c/o a mild amount of intermittent left sided aching chest pain.  Pt also c/o of SOB.

## 2016-08-22 NOTE — ED Notes (Signed)
ED Provider at bedside. 

## 2016-08-22 NOTE — ED Provider Notes (Signed)
[facility] DEPT Provider Note   CSN: [id number] Arrival date & time: 08/22/16  0545     History   Chief Complaint Chief Complaint  Patient presents with  . Dizziness    HPI Mathew Ramsey is a 51 y.o. male.  HPI   Pt with hx hx DM, HTN, HLD, obesity p/w multiple complaints.  States 4 days ago his right earlobe was itching and is now leathery and dry, lips also became dry and feel like there is sand on them, his bilateral lower legs developed a nonpainful,nonpruritic rash described as red dots.  Two days ago developed cough, chest congestion.  Feels like there is "fluid in (his) windpipe"  Cough is dry.  Yesterday he took nyquil and went to bed.  This morning woke up feeling lightheaded, weak, with SOB.  SOB lasted about 2 hours and improved once he arrived to ED.  Denies myalgias, chills, fever, nasal congestion, sore throat.  Denies chest pain.   Denies recent medication changes, travel.  He has had one recent sick contact at work with URI.  Did get the flu shot this year.    Past Medical History:  Diagnosis Date  . Allergy   . Diabetes mellitus   . Dyslipidemia   . Hyperlipidemia   . Hypertension   . Obesity   . Sleep apnea     Patient Active Problem List   Diagnosis Date Noted  . Hyperlipidemia associated with type 2 diabetes mellitus ([place]) 10/15/2014  . Personal history of noncompliance with medical treatment, presenting hazards to health 10/15/2014  . Hypertension associated with diabetes ([place]) 07/10/2013  . Hypogonadism male 01/31/2012  . Diabetes mellitus ([place]) 05/08/2011  . Obesity   . DM type 2 with diabetic dyslipidemia ([place])   . Sleep apnea     History reviewed. No pertinent surgical history.     Home Medications    Prior to Admission medications   Medication Sig Start Date End Date Taking? Authorizing Provider  benazepril (LOTENSIN) 10 MG tablet Take 1 tablet (10 mg total) by mouth daily. Patient taking differently: Take 20 mg by mouth daily.   10/15/14  Yes Denita Lung, MD  lansoprazole (PREVACID) 15 MG capsule Take 15 mg by mouth daily at 12 noon.   Yes Historical Provider, MD  pioglitazone-metformin (ACTOPLUS MET) 15-850 MG per tablet Take 1 tablet by mouth 2 (two) times daily with a meal. 10/15/14  Yes Denita Lung, MD  simvastatin (ZOCOR) 20 MG tablet Take 20 mg by mouth daily.   Yes Historical Provider, MD  sitaGLIPtin (JANUVIA) 100 MG tablet Take 100 mg by mouth daily.   Yes Historical Provider, MD  carvedilol (COREG) 6.25 MG tablet Take 1 tablet (6.25 mg total) by mouth 2 (two) times daily with a meal. Patient not taking: Reported on 08/22/2016 10/15/14   Denita Lung, MD  fexofenadine-pseudoephedrine (ALLEGRA-D 24 HOUR) 180-240 MG per 24 hr tablet Take 1 tablet by mouth daily. Patient not taking: Reported on 08/22/2016 10/30/13   Denita Lung, MD  HYDROcodone-homatropine [facility]) 5-1.5 MG/5ML syrup Take 5 mLs by mouth every 6 (six) hours as needed for cough. 08/22/16   Clayton Bibles, PA-C  rosuvastatin (CRESTOR) 10 MG tablet Take 1 tablet (10 mg total) by mouth daily. Patient not taking: Reported on 08/22/2016 10/15/14   Denita Lung, MD  sildenafil (VIAGRA) 100 MG tablet Take 1 tablet (100 mg total) by mouth daily as needed for erectile dysfunction. Patient not taking: Reported on 10/15/2014  02/11/14   Denita Lung, MD    Family History History reviewed. No pertinent family history.  Social History Social History  Substance Use Topics  . Smoking status: Former Smoker    Quit date: 04/12/2010  . Smokeless tobacco: Never Used  . Alcohol use 1.0 oz/week    2 drink(s) per week     Comment: beer on weekend     Allergies   Sulfa antibiotics   Review of Systems Review of Systems  All other systems reviewed and are negative.    Physical Exam Updated Vital Signs BP 190/85   Pulse 101   Temp 98.5 F (36.9 C) (Oral)   Resp 12   Ht 5' 11"  (1.803 m)   Wt (!) 158.8 kg   SpO2 99%   BMI 48.82 kg/m   Physical  Exam  Constitutional: He appears well-developed and well-nourished. No distress.  HENT:  Head: Normocephalic and atraumatic.  Mouth/Throat: Uvula is midline. Posterior oropharyngeal erythema present. No oropharyngeal exudate or posterior oropharyngeal edema.  Lips are dry.  Oral mucosa without lesions.   Right earlobe with dry skin.  No rash.    Neck: Neck supple.  Cardiovascular: Normal rate and regular rhythm.   Pulmonary/Chest: Effort normal and breath sounds normal. No respiratory distress. He has no wheezes. He has no rales.  Abdominal: Soft. He exhibits no distension and no mass. There is no tenderness. There is no rebound and no guarding.  Neurological: He is alert. He exhibits normal muscle tone.  Skin: He is not diaphoretic.  Petechial rash over bilateral lower extremities, nonblanching.    Nursing note and vitals reviewed.    ED Treatments / Results  Labs (all labs ordered are listed, but only abnormal results are displayed) Labs Reviewed  BASIC METABOLIC PANEL - Abnormal; Notable for the following:       Result Value   Sodium 133 (*)    Chloride 97 (*)    Glucose, Bld 218 (*)    All other components within normal limits  CBC - Abnormal; Notable for the following:    Platelets 132 (*)    All other components within normal limits  HEPATIC FUNCTION PANEL - Abnormal; Notable for the following:    ALT 114 (*)    Alkaline Phosphatase 135 (*)    All other components within normal limits  PROTIME-INR  APTT  I-STAT TROPOININ, ED  I-STAT TROPOININ, ED    EKG  EKG Interpretation  Date/Time:  Tuesday August 22 2016 06:01:39 EST Ventricular Rate:  94 PR Interval:  140 QRS Duration: 84 QT Interval:  372 QTC Calculation: 465 R Axis:   21 Text Interpretation:  Normal sinus rhythm Septal infarct , age undetermined Abnormal ECG No significant change since last tracing Confirmed by FLOYD MD, DANIEL [phone]) on 08/22/2016 11:13:24 AM       Radiology Dg Chest 2  View  Result Date: 08/22/2016 CLINICAL DATA:  Initial evaluation for acute chest pain with shortness of breath. EXAM: CHEST  2 VIEW COMPARISON:  Prior radiograph from 01/25/2012. FINDINGS: The cardiac and mediastinal silhouettes are stable in size and contour, and remain within normal limits. The lungs are normally inflated. No airspace consolidation, pleural effusion, or pulmonary edema is identified. There is no pneumothorax. No acute osseous abnormality identified. IMPRESSION: No active cardiopulmonary disease. Electronically Signed   By: Jeannine Boga M.D.   On: 08/22/2016 06:36    Procedures Procedures (including critical care time)  Medications Ordered in ED Medications  albuterol (  PROVENTIL HFA;VENTOLIN HFA) 108 (90 Base) MCG/ACT inhaler 2 puff (2 puffs Inhalation Given 08/22/16 1301)     Initial Impression / Assessment and Plan / ED Course  I have reviewed the triage vital signs and the nursing notes.  Pertinent labs & imaging results that were available during my care of the patient were reviewed by me and considered in my medical decision making (see chart for details).     Afebrile nontoxic patient with multiple complaints, most notably cough and now resolved episode of SOB.  Also with nonblanching rash of the lower extremities.  Discussed pt with Dr Tyrone Nine who also examined the patient.  Workup reassuring.  Question whether the cough and fatigue is early influenza.  Unclear etiology of rash though Dr Tyrone Nine thinks it may be related to dependent edema.  Pt given symptomatic treatment, encouraged to hydrate and monitor blood sugars well at home, close PCP follow up.  Discussed result, findings, treatment, and follow up  with patient.  Pt given return precautions.  Pt verbalizes understanding and agrees with plan.      Final Clinical Impressions(s) / ED Diagnoses   Final diagnoses:  Cough  Rash    New Prescriptions Discharge Medication List as of 08/22/2016 12:49 PM     START taking these medications   Details  HYDROcodone-homatropine (HYCODAN) 5-1.5 MG/5ML syrup Take 5 mLs by mouth every 6 (six) hours as needed for cough., Starting Tue 08/22/2016, Print         [place], PA-C 08/22/16 [place], DO 08/22/16 1353

## 2017-01-04 ENCOUNTER — Telehealth: Payer: Self-pay | Admitting: Family Medicine

## 2017-01-04 NOTE — Telephone Encounter (Signed)
Samples of crestor in sample drawer. The have expired.

## 2017-05-29 ENCOUNTER — Encounter: Payer: Self-pay | Admitting: Cardiology

## 2017-06-05 HISTORY — PX: TRANSTHORACIC ECHOCARDIOGRAM: SHX275

## 2017-12-21 IMAGING — US US EXTREM LOW VENOUS*R*
1 series · 13 of 24 positions shown · non-contrast
Comparison: None.

CLINICAL DATA: Right lower extremity pain and edema for 3 days.



[Series 1: us extrem low venous*right* · 13 of 30 slices shown]
[im 1/30]
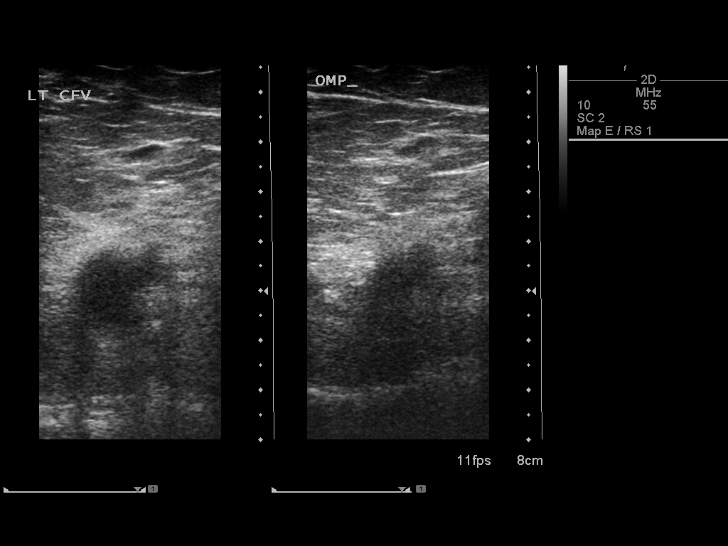
[im 3/30]
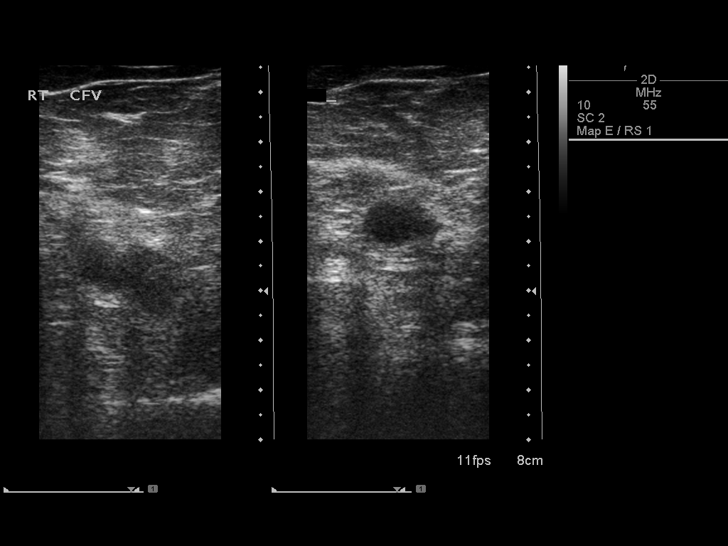
[im 6/30]
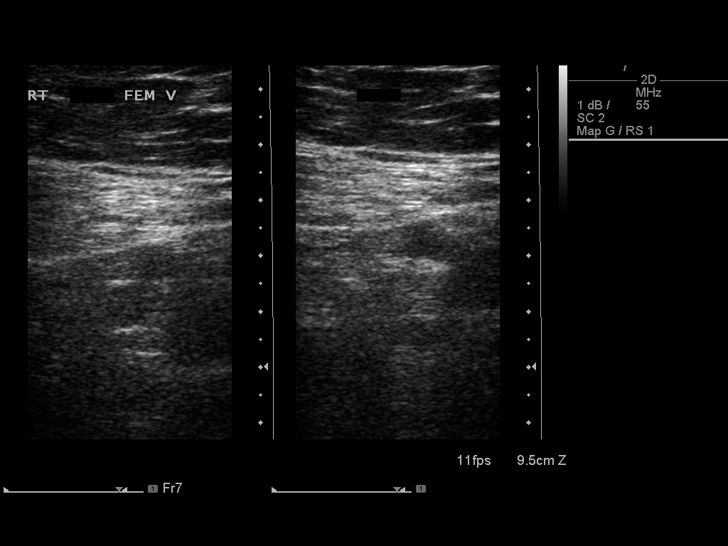
[im 8/30]
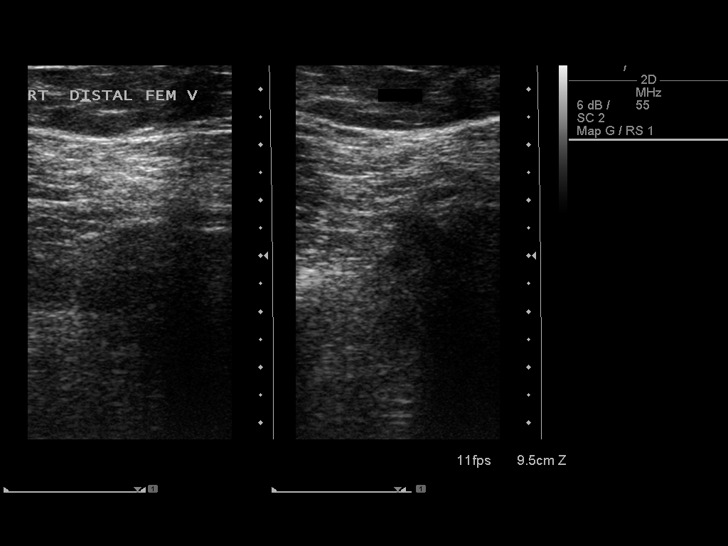
[im 11/30]
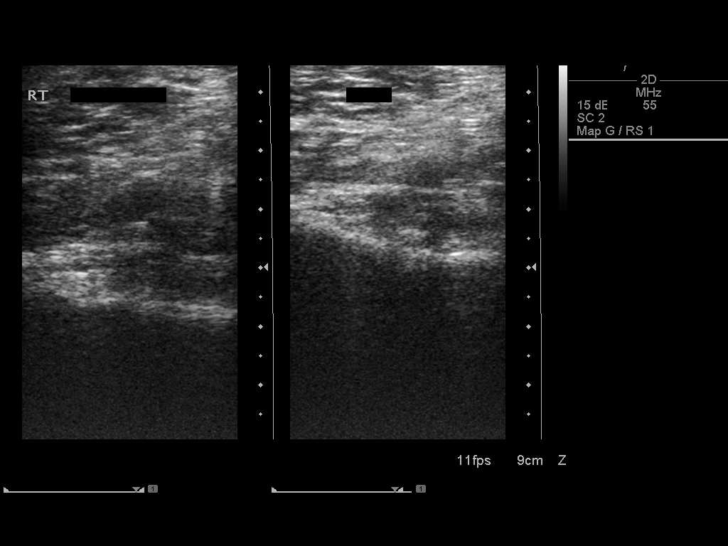
[im 13/30]
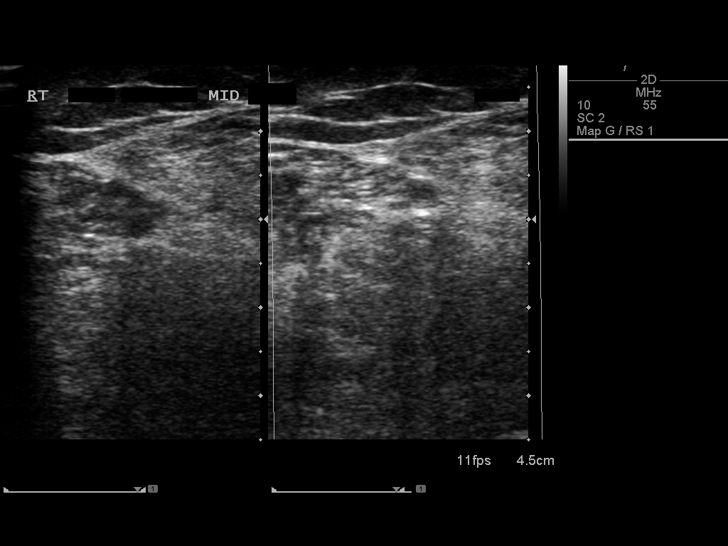
[im 16/30]
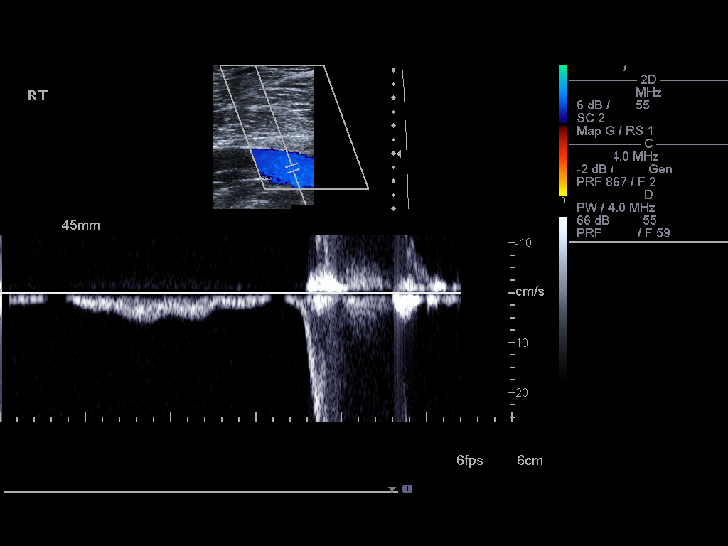
[im 17/30]
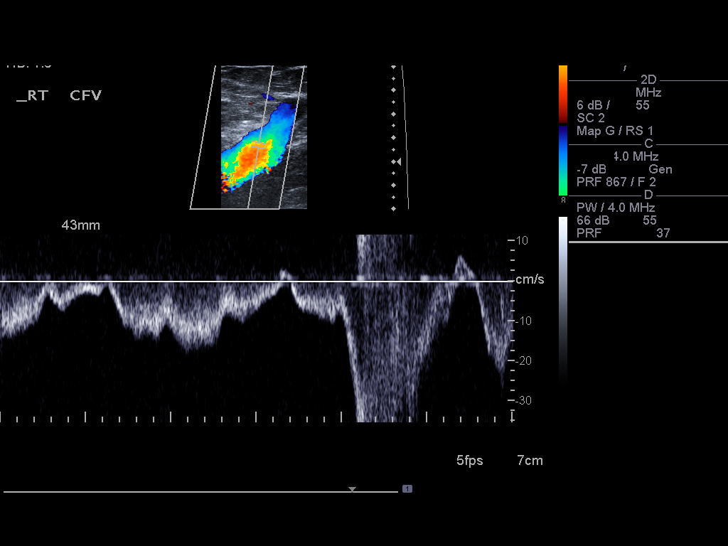
[im 19/30]
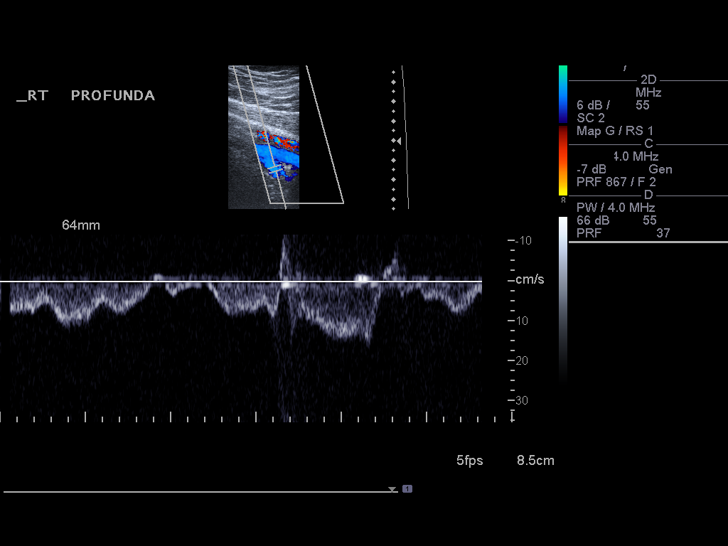
[im 22/30]
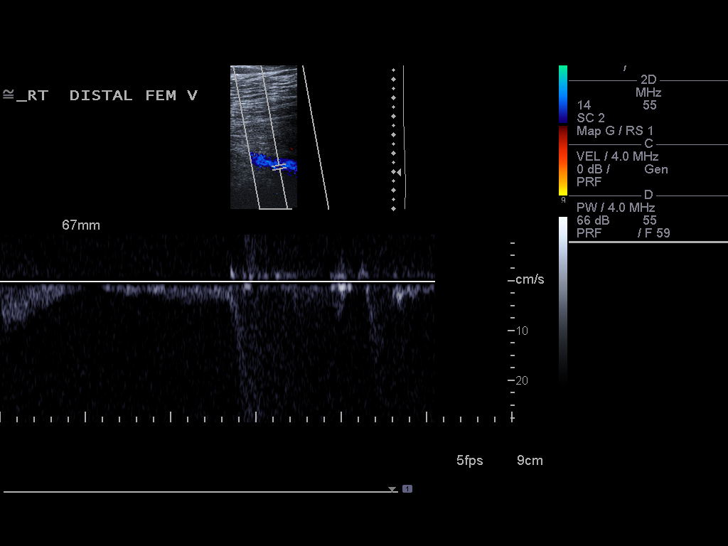
[im 24/30]
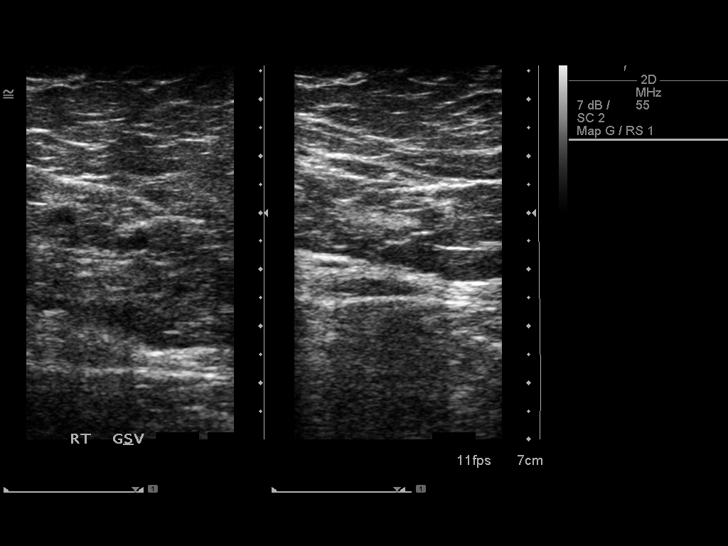
[im 27/30]
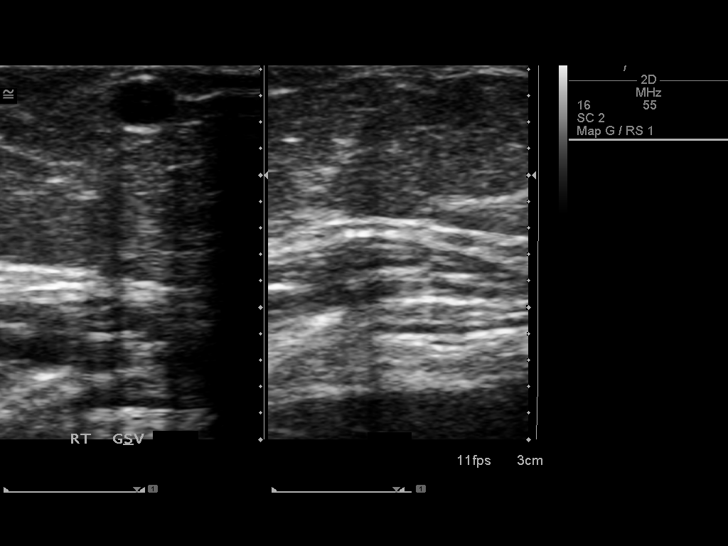
[im 30/30]
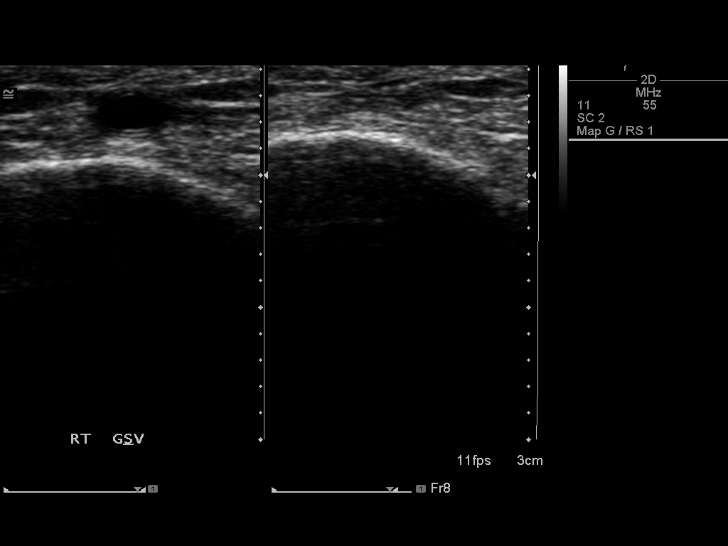

[13 of 24 positions shown; findings below may reference images not displayed]

FINDINGS: Limited exam because of body habitus.

Contralateral Common Femoral Vein: Respiratory phasicity is normal
and symmetric with the symptomatic side. No evidence of thrombus.
Normal compressibility.

Common Femoral Vein: No evidence of thrombus. Normal
compressibility, respiratory phasicity and response to augmentation.

Saphenofemoral Junction: No evidence of thrombus. Normal
compressibility and flow on color Doppler imaging.

Profunda Femoral Vein: No evidence of thrombus. Normal
compressibility and flow on color Doppler imaging.

Femoral Vein: No evidence of thrombus. Normal compressibility,
respiratory phasicity and response to augmentation.

Popliteal Vein: No evidence of thrombus. Normal compressibility,
respiratory phasicity and response to augmentation.

Calf Veins: Limited visualization. Posterior tibial vein appears
patent. Peroneal vein not demonstrated.

Superficial Great Saphenous Vein: No evidence of thrombus. Normal
compressibility and flow on color Doppler imaging.

Venous Reflux:  None.

Other Findings:  None.
IMPRESSION: Limited exam because of body habitus. No significant right lower
extremity femoral popliteal occlusive DVT.

## 2019-01-03 IMAGING — CR DG CHEST 2V
2 series · 2 of 2 positions shown · non-contrast
Comparison: Prior radiograph from 01/25/2012.

CLINICAL DATA: Initial evaluation for acute chest pain with
shortness of breath.

EXAM:
CHEST  2 VIEW

[chest pa]
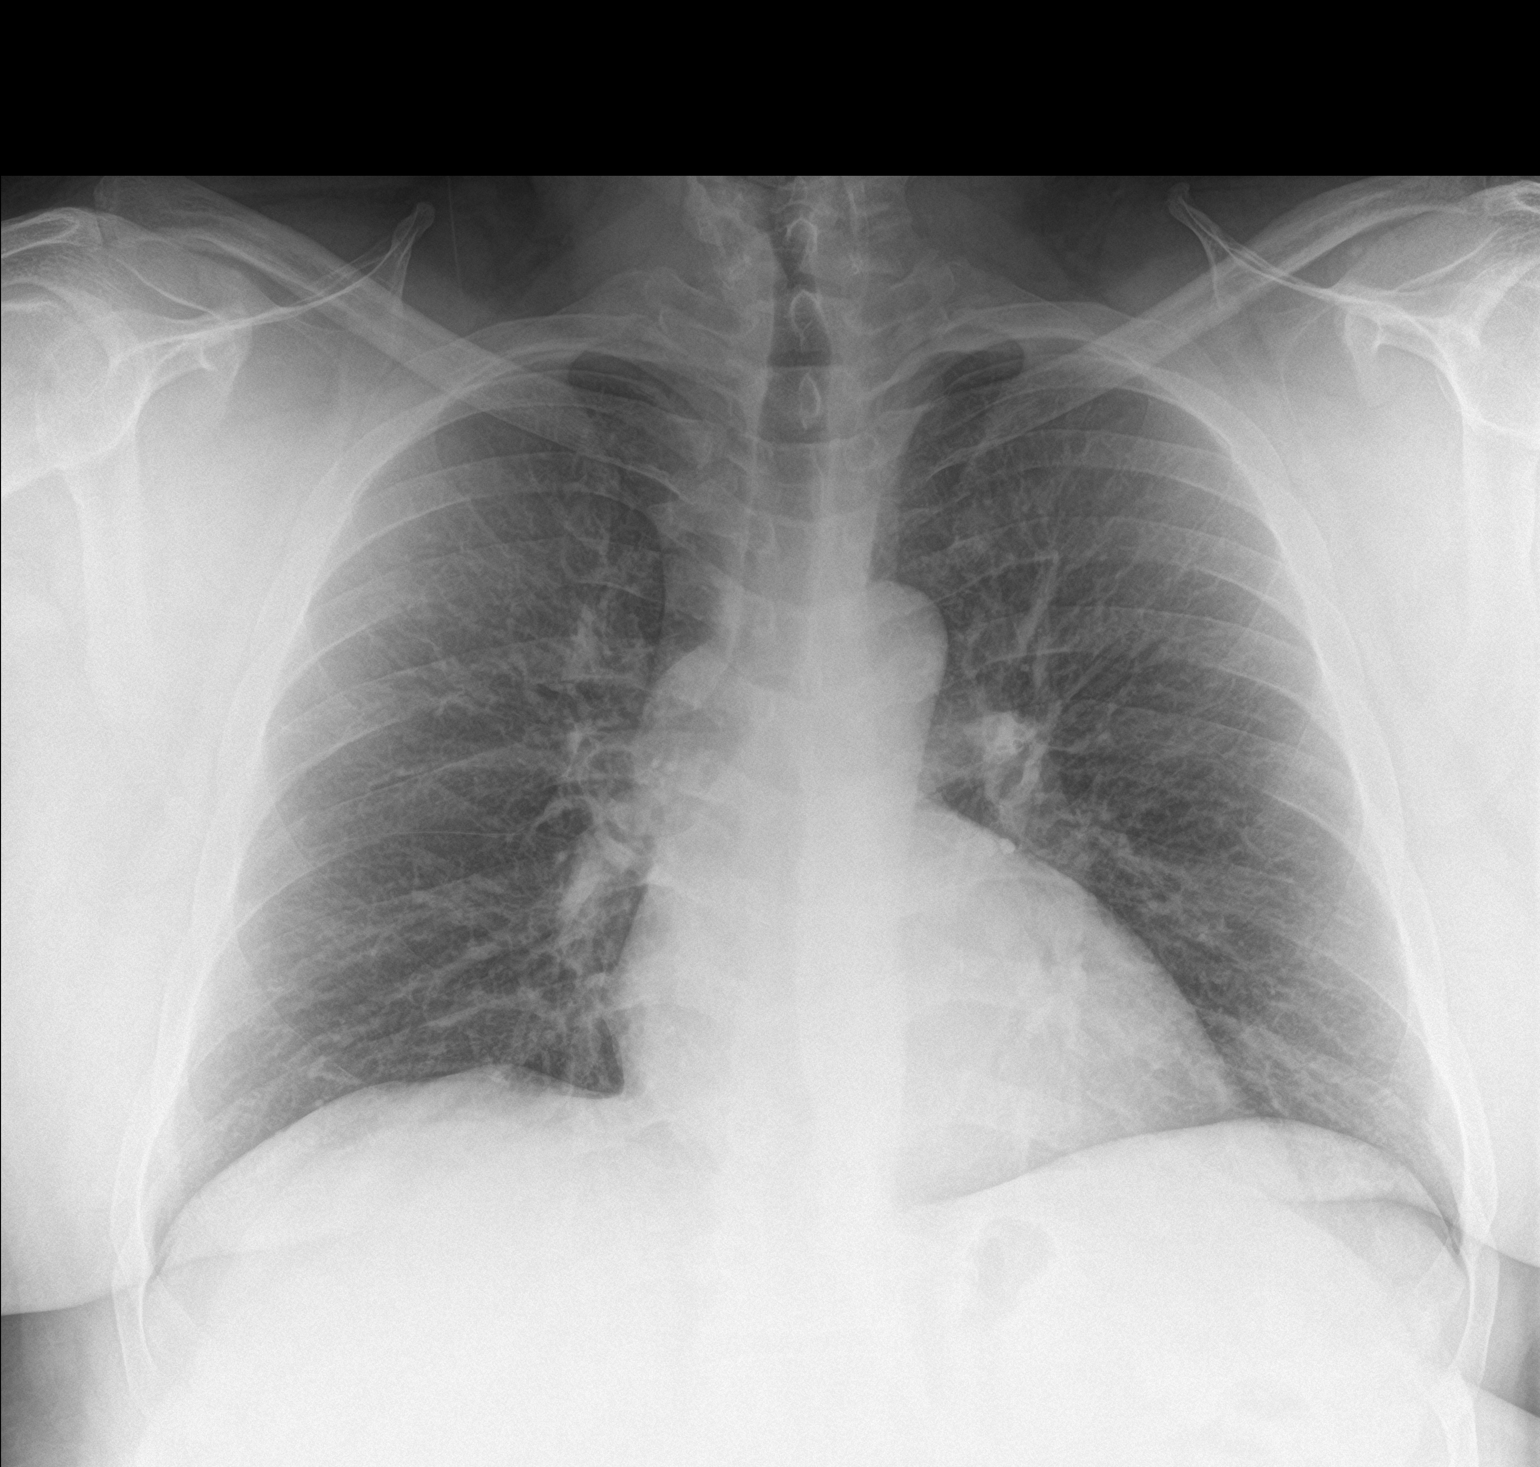

[chest lat]
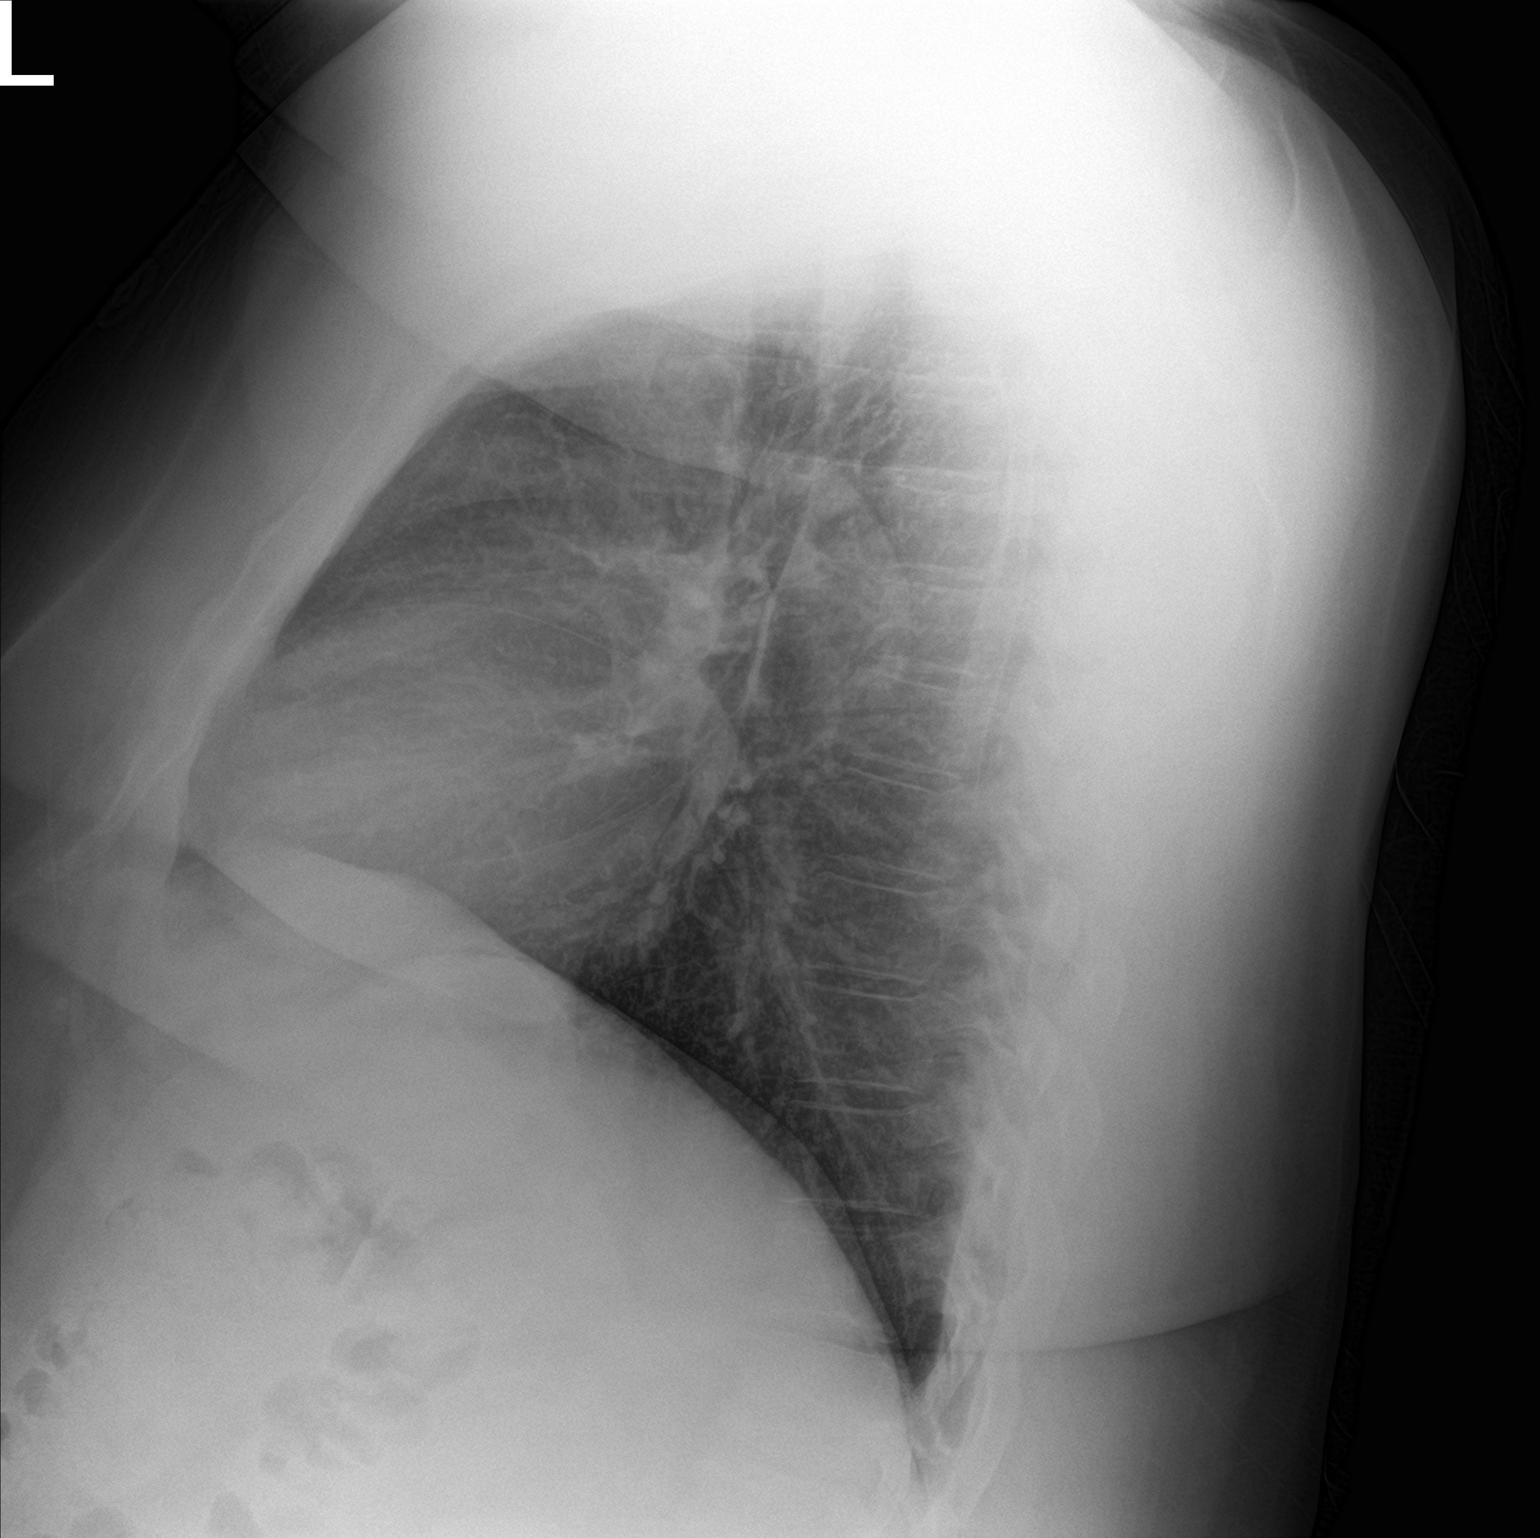

[2 of 2 positions shown; findings below may reference images not displayed]

FINDINGS: The cardiac and mediastinal silhouettes are stable in size and
contour, and remain within normal limits.

The lungs are normally inflated. No airspace consolidation, pleural
effusion, or pulmonary edema is identified. There is no
pneumothorax.

No acute osseous abnormality identified.
IMPRESSION: No active cardiopulmonary disease.

## 2019-11-26 ENCOUNTER — Telehealth: Payer: Self-pay | Admitting: *Deleted

## 2019-11-26 NOTE — Telephone Encounter (Signed)
RN received information . Patient is requesting new patient visit .  Rn spoke to wife - informed  Ms Mathew Ramsey  - of appointment time and date and direction to office.( 12/01/19 at 8 am) .  Patient does not have cardiologist at present time.  patient  Would like to be evaluated per wife. Has an history of  Irregular heart rate and blood pressure issues.    wife verbalized understanding and aware to bring medications and wife may attend visit as well.

## 2019-12-01 ENCOUNTER — Encounter: Payer: Self-pay | Admitting: Cardiology

## 2019-12-01 ENCOUNTER — Ambulatory Visit: Payer: PRIVATE HEALTH INSURANCE | Admitting: Cardiology

## 2019-12-01 NOTE — Progress Notes (Deleted)
Primary Care Provider: Helane Rima, MD/Shane Ouida [place], [place] Cardiologist: No primary care provider on file. Electrophysiologist: None  Clinic Note: No chief complaint on file.   HPI:    Mathew Ramsey is a 54 y.o. male with a PMH notable for DM-2, HTN, HLD and Morbid Obesity with OSA who is being seen today for the evaluation of *** at the request of Helane Rima, MD.  Mathew Ramsey was seen by [facility] cardiology back in October and November 2018 for chest discomfort evaluation actually was more epigastric pain potentially related to her hiatal hernia.  Discomfort was usually associated with eating.  Described fullness in the epigastric region with some dyspnea..  Echocardiogram done was normal.  30-day event monitor was normal.  Mathew Ramsey was last seen on November 05, 2019 by Dr. Lavone Neri.  He was relatively hypotensive with blood pressure 152/102 with a heart rate of 101.  He weighed 376 pounds-BMI 52.7. ->  Was counseled on dietary modification and increased activity.  A1c as of February was 7.3 April. (Had previously been on Ozempic because of nausea and glipizide) -> plan to restart Ozempic. Indicated that he had not been taking his blood pressure medications because he forgot to put them in his pill container.  Recent Hospitalizations: None  Reviewed  CV studies:    The following studies were reviewed today: (if available, images/films reviewed: From Epic Chart or Care Everywhere) . 06/05/2017-transthoracic Echo: Normal LV size and function.  Mild concentric LVH.  EF is 6065%.  Normal wall motion.  GR 1 DD.  Normal atria.  Trace MR and TR.  Normal aortic valve. . November 2018-event monitor: Mostly sinus rhythm and sinus tachycardia.  No arrhythmias noted.  High average heart rate 110, low average heart rate 90 bpm.  Rare isolated PVCs.   Interval History:   Mathew Ramsey   CV Review of Symptoms (Summary) Cardiovascular ROS: {roscv:310661}  The patient {does/does  not:200015} have symptoms concerning for COVID-19 infection (fever, chills, cough, or new shortness of breath).  The patient {ACTION; IS/IS [id number] practicing social distancing & Masking.    REVIEWED OF SYSTEMS   ROS   I have reviewed and (if needed) personally updated the patient's problem list, medications, allergies, past medical and surgical history, social and family history.   PAST MEDICAL HISTORY   Past Medical History:  Diagnosis Date  . Allergy   . Diabetes mellitus   . Dyslipidemia   . Hyperlipidemia   . Hypertension   . Obesity   . Sleep apnea     PAST SURGICAL HISTORY   No past surgical history on file.  MEDICATIONS/ALLERGIES   No outpatient medications have been marked as taking for the 12/01/19 encounter (Appointment) with Leonie Man, MD.  Atorvastatin 40 mg daily, benazepril-HCTZ 20-12.5 mg 2 tablets daily, lansoprazole 15 mg daily, Metformin 500 mg tabs-2 tabs twice daily, pioglitazone 30 mg with breakfast   Auto CPAP 7 to 5-15 cm water.  Allergies  Allergen Reactions  . Sulfa Antibiotics Other (See Comments)    [name] Syndrome.    SOCIAL HISTORY/FAMILY HISTORY   Social History   Tobacco Use  . Smoking status: Former Smoker    Quit date: 04/12/2010    Years since quitting: 9.6  . Smokeless tobacco: Never Used  Substance Use Topics  . Alcohol use: Yes    Alcohol/week: 2.0 standard drinks    Types: 2 drink(s) per week    Comment: beer on weekend  . Drug  use: No   Social History   Social History Narrative  . Not on file   No family history on file.   OBJCTIVE -PE, EKG, labs   Wt Readings from Last 3 Encounters:  08/22/16 (!) 350 lb (158.8 kg)  10/15/14 (!) 371 lb 9.6 oz (168.6 kg)  09/07/14 (!) 346 lb (156.9 kg)    Physical Exam: There were no vitals taken for this visit. Physical Exam   Adult ECG Report  Rate: *** ;  Rhythm: {rhythm:17366};   Narrative Interpretation: ***  Recent Labs: September 19, 2019  Na+ ***, K+ ***, Cl- ***, HCO3- *** , BUN ***, Cr ***, Glu ***, Ca2+ ***; AST ***, ALT ***, AlkP ***  CBC: W ***, H/H ***/***, Plt ***  TC ***, TG ***, HDL ***, LDL *** Lab Results  Component Value Date   CHOL 237 (H) 10/15/2014   HDL 53 10/15/2014   LDLCALC 158 (H) 10/15/2014   TRIG 129 10/15/2014   CHOLHDL 4.5 10/15/2014   Lab Results  Component Value Date   CREATININE 1.01 08/22/2016   BUN 11 08/22/2016   NA 133 (L) 08/22/2016   K 4.4 08/22/2016   CL 97 (L) 08/22/2016   CO2 26 08/22/2016   No results found for: TSH  ASSESSMENT/PLAN    Problem List Items Addressed This Visit    None       COVID-19 Education: The signs and symptoms of COVID-19 were discussed with the patient and how to seek care for testing (follow up with PCP or arrange E-visit).   The importance of social distancing and COVID-19 vaccination was discussed today.  I spent a total of ***minutes with the patient. >  50% of the time was spent in direct patient consultation.  Additional time spent with chart review  / charting (studies, outside notes, etc): *** Total Time: *** min   Current medicines are reviewed at length with the patient today.  (+/- concerns) ***  Notice: This dictation was prepared with Dragon dictation along with smaller phrase technology. Any transcriptional errors that result from this process are unintentional and may not be corrected upon review.  Patient Instructions / Medication Changes & Studies & Tests Ordered   There are no Patient Instructions on file for this visit.   Studies Ordered:   No orders of the defined types were placed in this encounter.    Mathew Ramsey, M.D., M.S. Interventional Cardiologist   Pager # [phone] Phone # [phone] [address] [place], [place] [address]   Thank you for choosing [facility]!!

## 2019-12-02 NOTE — Progress Notes (Signed)
EVENT MONITOR SUMMARY: November 2018  Baseline rhythm is sinus rhythm.  High average rate 110 bpm, low average rate 90 bpm.  14 triggered episodes of chest pain dizziness lightheadedness rapid heart rates etc. seen during sinus rhythm/sinus tachycardia with occasional PVCs.No pauses.  Rare PVCs.  No arrhythmias.  Glenetta Hew, MD

## 2020-01-01 ENCOUNTER — Encounter: Payer: Self-pay | Admitting: Cardiology

## 2020-01-01 NOTE — Progress Notes (Signed)
Primary Care Provider: Gregor Hams, FNP Cardiologist: No primary care provider on file. Electrophysiologist: None  Clinic Note: Chief Complaint  Patient presents with  . New Patient (Initial Visit)    Cardiac risk factors  . Shortness of Breath    With some chest Ramsey  . Edema    HPI:    Mathew Ramsey is a 54 y.o. male with a history of DM 2 (no insulin), hypertension, hyperlipidemia and OSA on CPAP who is being seen today for the evaluation of Mathew Ramsey at the request of Helane Rima, MD/Shane Ouida Sills, [place].  Mathew Ramsey was seen in [facility] in November 2018 for symptoms of epigastric Ramsey radiating to the chest worsening with lying down, after eating, and if getting into a car for travel after eating a meal.  He was evaluated with an echocardiogram and event monitor.  Echocardiogram was essentially normal as reviewed below in past surgical history.  Event monitor was basically normal.  Recent Hospitalizations: none  Reviewed  CV studies:    The following studies were reviewed today: (if available, images/films reviewed: From Epic Chart or Care Everywhere) . Echocardiogram (November 2018) reviewed, PSH updated. . Event monitor (November 2018) reviewed, essentially normal.  Interval History:   Mathew Ramsey is here today to establish cardiology care.  He is concerned because of his cardiac risk factors of hypertension, hyperlipidemia and diabetes as well as OSA and obesity.    Mathew Ramsey is noted symptoms of shortness of breath with exertion has seems to gotten worse over the last several months since late 2020.  He also describes a sensation of sharp stabbing Ramsey in his left upper chest that radiates to his left arm.  This is sometimes associated with exertion, not always.  He says sometimes there is sharpness but other times it is more like a pressure.  The symptoms do not last very long, but they tend to get worse if he  does anything when they are happening.  They can occur at rest and get worse with exertion.  He also describes occasional epigastric symptom that Goes up into his chest with fullness that often occurs after eating but sometimes with exertion as well.  He says he was diagnosed with OSA years ago but was not able to tolerate CPAP and really has not really been reevaluated in the last 5 years.  That was in [place].  If his heart rate is often fast, but not irregular or skipping.  CV Review of Symptoms (Summary) Cardiovascular ROS: positive for - chest Ramsey, dyspnea on exertion, edema and rapid heart rate negative for - irregular heartbeat, orthopnea, palpitations, paroxysmal nocturnal dyspnea, shortness of breath or Syncope/near syncope, TIA/amaurosis fugax, claudication  The patient does not have symptoms concerning for COVID-19 infection (fever, chills, cough, or new shortness of breath).  The patient is practicing social distancing & Masking.   He has had both his COVID-19 vaccines  REVIEWED OF SYSTEMS   Review of Systems  Constitutional: Positive for malaise/fatigue (Does not have a lot of energy). Negative for weight loss.  HENT: Negative for congestion and nosebleeds.   Respiratory: Positive for shortness of breath (With exertion). Negative for cough.   Cardiovascular: Positive for leg swelling. Negative for claudication.  Gastrointestinal: Negative for abdominal Ramsey, blood in stool and melena.  Genitourinary: Negative for frequency.  Musculoskeletal: Negative for joint Ramsey and myalgias.  Neurological: Negative for dizziness, focal weakness and headaches.  Psychiatric/Behavioral:  Negative for depression and memory loss. The patient is not nervous/anxious and does not have insomnia.   All other systems reviewed and are negative.  I have reviewed and (if needed) personally updated the patient's problem list, medications, allergies, past medical and surgical history, social and  family history.   PAST MEDICAL HISTORY   Past Medical History:  Diagnosis Date  . Allergy   . Diabetes mellitus type II, uncontrolled ([place])   . Dyslipidemia   . Hyperlipidemia   . Hypertension   . Obesity   . OSA on CPAP     PAST SURGICAL HISTORY   Past Surgical History:  Procedure Laterality Date  . TRANSTHORACIC ECHOCARDIOGRAM  06/05/2017   [facility]: Mild concentric LVH.  EF 60 to 65%.  No R WMA.  GR 1 DD.  Normal RV size with mildly reduced function.  Unable assess RV pressures.  Grossly normal aortic valve.    MEDICATIONS/ALLERGIES   Current Meds  Medication Sig  . atorvastatin (LIPITOR) 40 MG tablet Take by mouth.  . benazepril-hydrochlorthiazide (LOTENSIN HCT) 20-12.5 MG tablet Take by mouth.  . Blood Glucose Monitoring Suppl (ONE TOUCH ULTRA 2) w/Device KIT Use to check blood sugar as directed  . fexofenadine-pseudoephedrine (ALLEGRA-D 24 HOUR) 180-240 MG per 24 hr tablet Take 1 tablet by mouth daily.  Marland Kitchen glucose blood test strip Use to check blood sugar 2 time(s) daily.  . Insulin Pen Needle (PEN NEEDLES) 31G X 6 MM MISC by Does not apply route.  . metFORMIN (GLUCOPHAGE-XR) 500 MG 24 hr tablet Take by mouth.  . pioglitazone-metformin (ACTOPLUS MET) 15-850 MG per tablet Take 1 tablet by mouth 2 (two) times daily with a meal.  . rosuvastatin (CRESTOR) 10 MG tablet Take 1 tablet (10 mg total) by mouth daily.  . sildenafil (REVATIO) 20 MG tablet Take 50MG (2 and a half tablets) to 100MG (5 tablets) 30 minutes prior to sex, no more than once per day.  . sildenafil (VIAGRA) 100 MG tablet Take 1 tablet (100 mg total) by mouth daily as needed for erectile dysfunction.  . sitaGLIPtin (JANUVIA) 100 MG tablet Take 100 mg by mouth daily.    Allergies  Allergen Reactions  . Sulfa Antibiotics Other (See Comments)    [name] Syndrome.    SOCIAL HISTORY/FAMILY HISTORY   Social History   Tobacco Use  . Smoking status: Former Smoker    Quit  date: 04/12/2010    Years since quitting: 9.7  . Smokeless tobacco: Never Used  Substance Use Topics  . Alcohol use: Yes    Alcohol/week: 2.0 standard drinks    Types: 2 drink(s) per week    Comment: beer on weekend  . Drug use: No   Social History   Social History Narrative   He was recently elected as the [employer] of [place]   family history includes Alcohol abuse in his father; Diabetes Mellitus II in his mother and sister; Heart disease in his brother; Heart failure in his brother and mother; Hyperlipidemia in his mother; Hypertension in his mother.   OBJCTIVE -PE, EKG, labs   Wt Readings from Last 3 Encounters:  01/02/20 265 lb (120.2 kg)  08/22/16 (!) 350 lb (158.8 kg)  10/15/14 (!) 371 lb 9.6 oz (168.6 kg)    Physical Exam: BP 112/90   Pulse (!) 107   Ht 5' 11"  (1.803 m)   Wt 265 lb (120.2 kg)   SpO2 99%   BMI 36.96 kg/m  Physical  Exam  Constitutional: He is oriented to person, place, and time. He appears well-developed and well-nourished. No distress.  Moderately obese gentleman with truncal obesity.  Well-groomed, healthy-appearing.  HENT:  Head: Normocephalic and atraumatic.  Neck: No hepatojugular reflux and no JVD (Unable to assess) present. Carotid bruit is not present. No thyromegaly present.  Cardiovascular: Regular rhythm, S1 normal, S2 normal and intact distal pulses.  No extrasystoles are present. Tachycardia present. PMI is not displaced (Unable to assess). Exam reveals distant heart sounds. Exam reveals no gallop and no friction rub.  No murmur heard. Pulmonary/Chest: Effort normal and breath sounds normal. No respiratory distress. He has no wheezes. He has no rales.  Abdominal: Soft. Bowel sounds are normal. He exhibits no distension. There is no abdominal tenderness. There is no rebound.  Truncal obesity.  Unable to assess HSM.  Musculoskeletal:        General: No edema. Normal range of motion.     Cervical back: Normal range of  motion and neck supple.  Neurological: He is alert and oriented to person, place, and time. No cranial nerve deficit.  Psychiatric: He has a normal mood and affect. His behavior is normal. Judgment and thought content normal.  Vitals reviewed.   Adult ECG Report  Rate: 104 ;  Rhythm: sinus tachycardia and Normal axis, intervals and durations.;   Narrative Interpretation: Other than tachycardia, normal EKG.  Recent Labs:    Lab Results  Component Value Date   CHOL 237 (H) 10/15/2014   HDL 53 10/15/2014   LDLCALC 158 (H) 10/15/2014   TRIG 129 10/15/2014   CHOLHDL 4.5 10/15/2014   Lab Results  Component Value Date   CREATININE 1.01 08/22/2016   BUN 11 08/22/2016   NA 133 (L) 08/22/2016   K 4.4 08/22/2016   CL 97 (L) 08/22/2016   CO2 26 08/22/2016   No results found for: TSH  ASSESSMENT/PLAN    Problem List Items Addressed This Visit    Obesity (Chronic)    Start about dietary modification and increase exercise.  Weight loss would be beneficial for his diabetes and hypertension as well as OSA.      Relevant Medications   metFORMIN (GLUCOPHAGE-XR) 500 MG 24 hr tablet   OZEMPIC, 1 MG/DOSE, 4 MG/3ML SOPN   Other Relevant Orders   CT CORONARY MORPH W/CTA COR W/SCORE W/CA W/CM &/OR WO/CM   CT CORONARY FRACTIONAL FLOW RESERVE DATA PREP   CT CORONARY FRACTIONAL FLOW RESERVE FLUID ANALYSIS   Sleep apnea (Chronic)    Previously diagnosed with sleep apnea, but now is out of date for CPAP titration.  Sleep study polysomnogram with likely CPAP titration and follow-up with Dr. Claiborne Billings      Relevant Orders   CT CORONARY MORPH W/CTA COR W/SCORE W/CA W/CM &/OR WO/CM   CT CORONARY FRACTIONAL FLOW RESERVE DATA PREP   CT CORONARY FRACTIONAL FLOW RESERVE FLUID ANALYSIS   Split night study   Hypertension associated with diabetes ([place]) (Chronic)    Blood pressure actually looks well controlled on current dose of benazepril-HCTZ. Apparently he was on carvedilol in the past, but not  currently listed.       Relevant Medications   atorvastatin (LIPITOR) 40 MG tablet   benazepril-hydrochlorthiazide (LOTENSIN HCT) 20-12.5 MG tablet   metFORMIN (GLUCOPHAGE-XR) 500 MG 24 hr tablet   OZEMPIC, 1 MG/DOSE, 4 MG/3ML SOPN   sildenafil (REVATIO) 20 MG tablet   Other Relevant Orders   EKG 12-Lead (Completed)   Basic metabolic panel  CT CORONARY MORPH W/CTA COR W/SCORE W/CA W/CM &/OR WO/CM   CT CORONARY FRACTIONAL FLOW RESERVE DATA PREP   CT CORONARY FRACTIONAL FLOW RESERVE FLUID ANALYSIS   Hyperlipidemia associated with type 2 diabetes mellitus ([place]) (Chronic)    Unfortunately, I do not have a lipid panel on him since 2018 with pretty high LDL at that time.  We will need to check a lipid panel if there is not 1 from PCP in follow-up available to see.  I cannot tell if he is on atorvastatin or rosuvastatin.  Both are listed.  He unfortunately will not sure which one he was on so we will readdress at follow-up visit.      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  Some features seem relatively atypical where other seen more concerning and that it can get worse with exertion.  Plan: Coronary CT angiogram with possible FFR.      Relevant Orders   EKG 12-Lead (Completed)   Basic metabolic panel   CT CORONARY MORPH W/CTA COR W/SCORE W/CA W/CM &/OR WO/CM   CT CORONARY FRACTIONAL FLOW RESERVE DATA PREP   CT CORONARY FRACTIONAL FLOW RESERVE FLUID ANALYSIS   DOE (dyspnea on exertion)    Exertional dyspnea symptoms concerning the gentleman who also has left-sided chest Ramsey radiating to the arm.  Need to exclude an  ischemic etiology.  Given his body habitus nuclear stress test will not likely be helpful with high chance of forging study.  Plan: Coronary CT angiogram with possible FFR.       Other Visit Diagnoses    Precordial Ramsey       Relevant Orders   CT CORONARY MORPH W/CTA COR W/SCORE W/CA W/CM &/OR WO/CM   CT CORONARY FRACTIONAL FLOW RESERVE DATA PREP   CT CORONARY FRACTIONAL FLOW RESERVE FLUID ANALYSIS       COVID-19 Education: The signs and symptoms of COVID-19 were discussed with the patient and how to seek care for testing (follow up with PCP or arrange E-visit).   The importance of social distancing and COVID-19 vaccination was discussed today.  I spent a total of 21mnutes with the patient. >  50% of the time was spent in direct patient consultation.  Additional time spent with chart review  / charting (studies, outside notes, etc): 12 Total Time: 440m   Current medicines are reviewed at length with the patient today.  (+/- concerns) n/a  Notice: This dictation was prepared with Dragon dictation along with smaller phrase technology. Any transcriptional errors that result from this process are unintentional and may not be corrected upon review.  Patient Instructions / Medication Changes & Studies & Tests Ordered   Patient Instructions  Medication Instructions:   see instruction for CCTA *If you need a refill on your cardiac medications before your next appointment, please call your pharmacy*   Lab Work:  See instruction for CCTA     Testing/Procedures: [id number] Once we have insurance authorization  Your physician has requested that you have cardiac CT. Cardiac computed tomography (CT) is a  painless test that uses an x-ray machine to take clear, detailed pictures of your heart. For further information please visit [email or web address]. Please follow instruction sheet as given.    And Will be schedule  at [facility] long sleep center Once we have insurance authorization  If you have any lab test that is abnormal or we need to change your treatment, we will call you to review the results. Your physician has recommended that you have a sleep study. This test records several body functions during sleep, including: brain activity, eye movement, oxygen and carbon dioxide blood levels, heart rate and rhythm, breathing rate and rhythm, the flow of air through your mouth and nose, snoring, body muscle movements, and chest and belly movement.     Follow-Up: At [facility], you and your health needs are our priority.  As part of our continuing mission to provide you with exceptional heart care, we have created designated Provider Care Teams.  These Care Teams include your primary Cardiologist (physician) and Advanced Practice Providers (APPs -  Physician Assistants and Nurse Practitioners) who all work together to provide you with the care you need, when you need it.  We recommend signing up for the patient portal called "MyChart".  Sign up information is provided on this After Visit Summary.  MyChart is used to connect with patients for Virtual Visits (Telemedicine).  Patients are able to view lab/test results, encounter notes, upcoming appointments, etc.  Non-urgent messages can be sent to your provider as well.   To learn more about what you can do with MyChart, go to [email or web address].    Your next appointment:   2 month(s)  The format for your next appointment:   In Person  Provider:   Glenetta Hew, MD   Other Instructions   Your cardiac CT will be scheduled at  the below location:   [facility] [address] [place], [place] [address] [phone]    If scheduled at [facility], please arrive at the [facility] main entrance of [facility] 30 minutes prior to test start time. Proceed to the [facility]iology Department (first floor) to  check-in and test prep.  .  Please follow these instructions carefully (unless otherwise directed):  Hold all erectile dysfunction medications at least 3 days (72 hrs) prior to test.  On the Night Before the Test: . Be sure to Drink plenty of water. . Do not consume any caffeinated/decaffeinated beverages or chocolate 12 hours prior to your test. . Do not take any antihistamines 12 hours prior to your test. . If you take Metformin, Actoplus do not take 24 hours prior to test.   On the Day of the Test: . Drink plenty of water. Do not drink any water within one hour of the test. . Do not eat any food 4 hours prior to the test. . You may take your regular medications prior to the test.  . Take metoprolol (Lopressor) 100 mg  two hours prior to test. . HOLD Furosemide/Hydrochlorothiazide  ( benazepril /hctz)  morning of the test.   After the Test: . Drink plenty of water. . After receiving IV contrast, you may experience a mild flushed feeling. This is normal. . On occasion, you may experience a mild rash up to 24 hours after the test. This is not dangerous. If this occurs, you can take Benadryl 25 mg and increase your fluid intake. . If you experience trouble breathing, this can be serious. If it is  severe call 911 IMMEDIATELY. If it is mild, please call our office. . If you take any of these medications: Glipizide/Metformin, Avandament, Glucavance, please do not take 48 hours after completing test unless otherwise instructed.   Once we have confirmed authorization from your insurance company, we will call you to set up a date and time for your test.   For non-scheduling related questions, please contact the cardiac imaging nurse navigator should you have any questions/concerns: Marchia Bond, Cardiac Imaging Nurse Navigator Burley Saver, Interim Cardiac Imaging Nurse [place] [facility] Direct Office Dial: [phone]   For scheduling needs, including  cancellations and rescheduling, please call [phone].     . Sleep Apnea Sleep apnea affects breathing during sleep. It causes breathing to stop for a short time or to become shallow. It can also increase the risk of:  Heart attack.  Stroke.  Being very overweight (obese).  Diabetes.  Heart failure.  Irregular heartbeat. The goal of treatment is to help you breathe normally again. What are the causes? There are three kinds of sleep apnea:  Obstructive sleep apnea. This is caused by a blocked or collapsed airway.  Central sleep apnea. This happens when the brain does not send the right signals to the muscles that control breathing.  Mixed sleep apnea. This is a combination of obstructive and central sleep apnea. The most common cause of this condition is a collapsed or blocked airway. This can happen if:  Your throat muscles are too relaxed.  Your tongue and tonsils are too large.  You are overweight.  Your airway is too small. What increases the risk?  Being overweight.  Smoking.  Having a small airway.  Being older.  Being male.  Drinking alcohol.  Taking medicines to calm yourself (sedatives or tranquilizers).  Having family members with the condition. What are the signs or symptoms?  Trouble staying asleep.  Being sleepy or tired during the day.  Getting angry a lot.  Loud snoring.  Headaches in the morning.  Not being able to focus your mind (concentrate).  Forgetting things.  Less interest in sex.  Mood swings.  Personality changes.  Feelings of sadness (depression).  Waking up a lot during the night to pee (urinate).  Dry mouth.  Sore throat. How is this diagnosed?  Your medical history.  A physical exam.  A test that is done when you are sleeping (sleep study). The test is most often done in a sleep lab but may also be done at home. How is this treated?   Sleeping on your side.  Using a medicine to get rid of  mucus in your nose (decongestant).  Avoiding the use of alcohol, medicines to help you relax, or certain Ramsey medicines (narcotics).  Losing weight, if needed.  Changing your diet.  Not smoking.  Using a machine to open your airway while you sleep, such as: ? An oral appliance. This is a mouthpiece that shifts your lower jaw forward. ? A CPAP device. This device blows air through a mask when you breathe out (exhale). ? An EPAP device. This has valves that you put in each nostril. ? A BPAP device. This device blows air through a mask when you breathe in (inhale) and breathe out.  Having surgery if other treatments do not work. It is important to get treatment for sleep apnea. Without treatment, it can lead to:  High blood pressure.  Coronary artery disease.  In men, not being able to have  an erection (impotence).  Reduced thinking ability. Follow these instructions at home: Lifestyle  Make changes that your doctor recommends.  Eat a healthy diet.  Lose weight if needed.  Avoid alcohol, medicines to help you relax, and some Ramsey medicines.  Do not use any products that contain nicotine or tobacco, such as cigarettes, e-cigarettes, and chewing tobacco. If you need help quitting, ask your doctor. General instructions  Take over-the-counter and prescription medicines only as told by your doctor.  If you were given a machine to use while you sleep, use it only as told by your doctor.  If you are having surgery, make sure to tell your doctor you have sleep apnea. You may need to bring your device with you.  Keep all follow-up visits as told by your doctor. This is important. Contact a doctor if:  The machine that you were given to use during sleep bothers you or does not seem to be working.  You do not get better.  You get worse. Get help right away if:  Your chest hurts.  You have trouble breathing in enough air.  You have an uncomfortable feeling in your back,  arms, or stomach.  You have trouble talking.  One side of your body feels weak.  A part of your face is hanging down. These symptoms may be an emergency. Do not wait to see if the symptoms will go away. Get medical help right away. Call your local emergency services (911 in the [place] Do not drive yourself to the hospital. Summary  This condition affects breathing during sleep.  The most common cause is a collapsed or blocked airway.  The goal of treatment is to help you breathe normally while you sleep. This information is not intended to replace advice given to you by your health care provider. Make sure you discuss any questions you have with your health care provider. Document Revised: 05/03/2018 Document Reviewed: 03/12/2018 Elsevier Patient Education  [place].      Studies Ordered:   Orders Placed This Encounter  Procedures  . CT CORONARY MORPH W/CTA COR W/SCORE W/CA W/CM &/OR WO/CM  . CT CORONARY FRACTIONAL FLOW RESERVE DATA PREP  . CT CORONARY FRACTIONAL FLOW RESERVE FLUID ANALYSIS  . Basic metabolic panel  . EKG 12-Lead  . Split night study     Glenetta Hew, M.D., M.S. Interventional Cardiologist   Pager # [phone] Phone # [phone] [address] [place], [place] [address]   Thank you for choosing Heartcare at [facility]!!

## 2020-01-02 ENCOUNTER — Encounter: Payer: Self-pay | Admitting: Cardiology

## 2020-01-02 ENCOUNTER — Ambulatory Visit (INDEPENDENT_AMBULATORY_CARE_PROVIDER_SITE_OTHER): Payer: BC Managed Care – PPO | Admitting: Cardiology

## 2020-01-02 ENCOUNTER — Other Ambulatory Visit: Payer: Self-pay

## 2020-01-02 VITALS — BP 112/90 | HR 107 | Ht 71.0 in | Wt 265.0 lb

## 2020-01-02 DIAGNOSIS — R072 Precordial pain: Secondary | ICD-10-CM

## 2020-01-02 DIAGNOSIS — I10 Essential (primary) hypertension: Secondary | ICD-10-CM

## 2020-01-02 DIAGNOSIS — E1159 Type 2 diabetes mellitus with other circulatory complications: Secondary | ICD-10-CM | POA: Diagnosis not present

## 2020-01-02 DIAGNOSIS — R0789 Other chest pain: Secondary | ICD-10-CM | POA: Insufficient documentation

## 2020-01-02 DIAGNOSIS — E785 Hyperlipidemia, unspecified: Secondary | ICD-10-CM

## 2020-01-02 DIAGNOSIS — R0600 Dyspnea, unspecified: Secondary | ICD-10-CM | POA: Diagnosis not present

## 2020-01-02 DIAGNOSIS — I152 Hypertension secondary to endocrine disorders: Secondary | ICD-10-CM

## 2020-01-02 DIAGNOSIS — E1169 Type 2 diabetes mellitus with other specified complication: Secondary | ICD-10-CM

## 2020-01-02 DIAGNOSIS — G4733 Obstructive sleep apnea (adult) (pediatric): Secondary | ICD-10-CM

## 2020-01-02 DIAGNOSIS — R0609 Other forms of dyspnea: Secondary | ICD-10-CM

## 2020-01-02 MED ORDER — METOPROLOL TARTRATE 50 MG PO TABS: 100.0000 mg | ORAL_TABLET | Freq: Once | ORAL | 0 refills | Status: DC

## 2020-01-02 NOTE — Patient Instructions (Addendum)
Medication Instructions:   see instruction for CCTA *If you need a refill on your cardiac medications before your next appointment, please call your pharmacy*   Lab Work:  See instruction for CCTA     Testing/Procedures: Will be schedule at [facility] radiology dept [address] Once we have insurance authorization  Your physician has requested that you have cardiac CT. Cardiac computed tomography (CT) is a painless test that uses an x-ray machine to take clear, detailed pictures of your heart. For further information please visit [email or web address]. Please follow instruction sheet as given.    And Will be schedule at [facility] long sleep center Once we have insurance authorization  If you have any lab test that is abnormal or we need to change your treatment, we will call you to review the results. Your physician has recommended that you have a sleep study. This test records several body functions during sleep, including: brain activity, eye movement, oxygen and carbon dioxide blood levels, heart rate and rhythm, breathing rate and rhythm, the flow of air through your mouth and nose, snoring, body muscle movements, and chest and belly movement.     Follow-Up: At [facility], you and your health needs are our priority.  As part of our continuing mission to provide you with exceptional heart care, we have created designated Provider Care Teams.  These Care Teams include your primary Cardiologist (physician) and Advanced Practice Providers (APPs -  Physician Assistants and Nurse Practitioners) who all work together to provide you with the care you need, when you need it.  We recommend signing up for the patient portal called "MyChart".  Sign up information is provided on this After Visit Summary.  MyChart is used to connect with patients for Virtual Visits (Telemedicine).  Patients are able to view lab/test results, encounter notes, upcoming appointments, etc.  Non-urgent  messages can be sent to your provider as well.   To learn more about what you can do with MyChart, go to [email or web address].    Your next appointment:   2 month(s)  The format for your next appointment:   In Person  Provider:   Glenetta Hew, MD   Other Instructions   Your cardiac CT will be scheduled at  the below location:   [facility] [address] [place], [place] [address] [phone]    If scheduled at [facility], please arrive at the [facility] main entrance of [facility] 30 minutes prior to test start time. Proceed to the [facility] Radiology Department (first floor) to check-in and test prep.  .  Please follow these instructions carefully (unless otherwise directed):  Hold all erectile dysfunction medications at least 3 days (72 hrs) prior to test.  On the Night Before the Test: . Be sure to Drink plenty of water. . Do not consume any caffeinated/decaffeinated beverages or chocolate 12 hours prior to your test. . Do not take any antihistamines 12 hours prior to your test. . If you take Metformin, Actoplus do not take 24 hours prior to test.   On the Day of the Test: . Drink plenty of water. Do not drink any water within one hour of the test. . Do not eat any food 4 hours prior to the test. . You may take your regular medications prior to the test.  . Take metoprolol (Lopressor) 100 mg  two hours prior to test. . HOLD Furosemide/Hydrochlorothiazide  ( benazepril /hctz)  morning of the test.  After the Test: . Drink plenty of water. . After receiving IV contrast, you may experience a mild flushed feeling. This is normal. . On occasion, you may experience a mild rash up to 24 hours after the test. This is not dangerous. If this occurs, you can take Benadryl 25 mg and increase your fluid intake. . If you experience trouble breathing, this can be serious. If it is severe call 911 IMMEDIATELY. If it is mild, please call  our office. . If you take any of these medications: Glipizide/Metformin, Avandament, Glucavance, please do not take 48 hours after completing test unless otherwise instructed.   Once we have confirmed authorization from your insurance company, we will call you to set up a date and time for your test.   For non-scheduling related questions, please contact the cardiac imaging nurse navigator should you have any questions/concerns: Marchia Bond, Cardiac Imaging Nurse Navigator Burley Saver, Interim Cardiac Imaging Nurse [place] [facility] Direct Office Dial: [phone]   For scheduling needs, including cancellations and rescheduling, please call [phone].     . Sleep Apnea Sleep apnea affects breathing during sleep. It causes breathing to stop for a short time or to become shallow. It can also increase the risk of:  Heart attack.  Stroke.  Being very overweight (obese).  Diabetes.  Heart failure.  Irregular heartbeat. The goal of treatment is to help you breathe normally again. What are the causes? There are three kinds of sleep apnea:  Obstructive sleep apnea. This is caused by a blocked or collapsed airway.  Central sleep apnea. This happens when the brain does not send the right signals to the muscles that control breathing.  Mixed sleep apnea. This is a combination of obstructive and central sleep apnea. The most common cause of this condition is a collapsed or blocked airway. This can happen if:  Your throat muscles are too relaxed.  Your tongue and tonsils are too large.  You are overweight.  Your airway is too small. What increases the risk?  Being overweight.  Smoking.  Having a small airway.  Being older.  Being male.  Drinking alcohol.  Taking medicines to calm yourself (sedatives or tranquilizers).  Having family members with the condition. What are the signs or symptoms?  Trouble staying asleep.  Being  sleepy or tired during the day.  Getting angry a lot.  Loud snoring.  Headaches in the morning.  Not being able to focus your mind (concentrate).  Forgetting things.  Less interest in sex.  Mood swings.  Personality changes.  Feelings of sadness (depression).  Waking up a lot during the night to pee (urinate).  Dry mouth.  Sore throat. How is this diagnosed?  Your medical history.  A physical exam.  A test that is done when you are sleeping (sleep study). The test is most often done in a sleep lab but may also be done at home. How is this treated?   Sleeping on your side.  Using a medicine to get rid of mucus in your nose (decongestant).  Avoiding the use of alcohol, medicines to help you relax, or certain pain medicines (narcotics).  Losing weight, if needed.  Changing your diet.  Not smoking.  Using a machine to open your airway while you sleep, such as: ? An oral appliance. This is a mouthpiece that shifts your lower jaw forward. ? A CPAP device. This device blows air through a mask when you breathe out (exhale). ? An  EPAP device. This has valves that you put in each nostril. ? A BPAP device. This device blows air through a mask when you breathe in (inhale) and breathe out.  Having surgery if other treatments do not work. It is important to get treatment for sleep apnea. Without treatment, it can lead to:  High blood pressure.  Coronary artery disease.  In men, not being able to have an erection (impotence).  Reduced thinking ability. Follow these instructions at home: Lifestyle  Make changes that your doctor recommends.  Eat a healthy diet.  Lose weight if needed.  Avoid alcohol, medicines to help you relax, and some pain medicines.  Do not use any products that contain nicotine or tobacco, such as cigarettes, e-cigarettes, and chewing tobacco. If you need help quitting, ask your doctor. General instructions  Take over-the-counter and  prescription medicines only as told by your doctor.  If you were given a machine to use while you sleep, use it only as told by your doctor.  If you are having surgery, make sure to tell your doctor you have sleep apnea. You may need to bring your device with you.  Keep all follow-up visits as told by your doctor. This is important. Contact a doctor if:  The machine that you were given to use during sleep bothers you or does not seem to be working.  You do not get better.  You get worse. Get help right away if:  Your chest hurts.  You have trouble breathing in enough air.  You have an uncomfortable feeling in your back, arms, or stomach.  You have trouble talking.  One side of your body feels weak.  A part of your face is hanging down. These symptoms may be an emergency. Do not wait to see if the symptoms will go away. Get medical help right away. Call your local emergency services (911 in the [place] Do not drive yourself to the hospital. Summary  This condition affects breathing during sleep.  The most common cause is a collapsed or blocked airway.  The goal of treatment is to help you breathe normally while you sleep. This information is not intended to replace advice given to you by your health care provider. Make sure you discuss any questions you have with your health care provider. Document Revised: 05/03/2018 Document Reviewed: 03/12/2018 Elsevier Patient Education  [place].

## 2020-01-05 ENCOUNTER — Ambulatory Visit: Payer: PRIVATE HEALTH INSURANCE | Admitting: Cardiology

## 2020-01-06 ENCOUNTER — Encounter: Payer: Self-pay | Admitting: Cardiology

## 2020-01-06 DIAGNOSIS — R0609 Other forms of dyspnea: Secondary | ICD-10-CM | POA: Insufficient documentation

## 2020-01-06 NOTE — Assessment & Plan Note (Signed)
Unfortunately, I do not have a lipid panel on him since 2018 with pretty high LDL at that time.  We will need to check a lipid panel if there is not 1 from PCP in follow-up available to see.  I cannot tell if he is on atorvastatin or rosuvastatin.  Both are listed.  He unfortunately will not sure which one he was on so we will readdress at follow-up visit.

## 2020-01-06 NOTE — Assessment & Plan Note (Addendum)
Blood pressure actually looks well controlled on current dose of benazepril-HCTZ. Apparently he was on carvedilol in the past, but not currently listed.

## 2020-01-06 NOTE — Assessment & Plan Note (Signed)
Exertional dyspnea symptoms concerning the gentleman who also has left-sided chest pain radiating to the arm.  Need to exclude an ischemic etiology.  Given his body habitus nuclear stress test will not likely be helpful with high chance of forging study.  Plan: Coronary CT angiogram with possible FFR.

## 2020-01-06 NOTE — Assessment & Plan Note (Signed)
Previously diagnosed with sleep apnea, but now is out of date for CPAP titration.  Sleep study polysomnogram with likely CPAP titration and follow-up with Dr. Claiborne Billings

## 2020-01-06 NOTE — Assessment & Plan Note (Signed)
Return chest pain that radiates to his arm in a gentleman with significant cardiac risk factors.  Some features seem relatively atypical where other seen more concerning and that it can get worse with exertion.  Plan: Coronary CT angiogram with possible FFR.

## 2020-01-06 NOTE — Assessment & Plan Note (Signed)
Start about dietary modification and increase exercise.  Weight loss would be beneficial for his diabetes and hypertension as well as OSA.

## 2020-01-16 ENCOUNTER — Telehealth: Payer: Self-pay | Admitting: *Deleted

## 2020-01-16 NOTE — Telephone Encounter (Signed)
-----   Message from Mathew Simmonds, RN sent at 01/06/2020  5:10 PM EDT -----  Pt was seen on 01/02/20   Dx OSA  - had sleep study @ 5 years ago -  in [place]  - he did not know the dr who sent him  He has not using machine ( not sure if he still has one)  Dr Ellyn Hack stated he could not tolerate the mask , it hink)  Order  split night.   I also informed patient his insurance may require a home study .   I am sorry  we did not do aepworth scale,   [employer]ed forces training and education officer   P.S.  I can not remember if I sent this earlier , I got  side tracked

## 2020-02-12 ENCOUNTER — Other Ambulatory Visit: Payer: Self-pay | Admitting: Cardiology

## 2020-02-12 DIAGNOSIS — G4733 Obstructive sleep apnea (adult) (pediatric): Secondary | ICD-10-CM

## 2020-02-20 ENCOUNTER — Telehealth (HOSPITAL_COMMUNITY): Payer: Self-pay | Admitting: *Deleted

## 2020-02-20 ENCOUNTER — Other Ambulatory Visit (HOSPITAL_COMMUNITY): Payer: Self-pay | Admitting: *Deleted

## 2020-02-20 LAB — BASIC METABOLIC PANEL
BUN/Creatinine Ratio: 12 (ref 9–20)
BUN: 13 mg/dL (ref 6–24)
CO2: 23 mmol/L (ref 20–29)
Calcium: 9.2 mg/dL (ref 8.7–10.2)
Chloride: 98 mmol/L (ref 96–106)
Creatinine, Ser: 1.05 mg/dL (ref 0.76–1.27)
GFR calc Af Amer: 94 mL/min/{1.73_m2} (ref >59)
GFR calc non Af Amer: 81 mL/min/{1.73_m2} (ref >59)
Glucose: 139 mg/dL — ABNORMAL HIGH (ref 65–99)
Potassium: 4.4 mmol/L (ref 3.5–5.2)
Sodium: 136 mmol/L (ref 134–144)

## 2020-02-20 MED ORDER — METOPROLOL TARTRATE 50 MG PO TABS: 100.0000 mg | ORAL_TABLET | Freq: Once | ORAL | 0 refills | Status: DC

## 2020-02-20 NOTE — Telephone Encounter (Signed)

## 2020-02-23 ENCOUNTER — Encounter: Payer: PRIVATE HEALTH INSURANCE | Admitting: *Deleted

## 2020-02-23 ENCOUNTER — Encounter (HOSPITAL_COMMUNITY): Payer: Self-pay

## 2020-02-23 ENCOUNTER — Ambulatory Visit (HOSPITAL_COMMUNITY)
Admission: RE | Admit: 2020-02-23 | Discharge: 2020-02-23 | Disposition: A | Discharge status: Home / Self Care | Payer: BC Managed Care – PPO | Source: Ambulatory Visit | Attending: Cardiology | Admitting: Cardiology

## 2020-02-23 ENCOUNTER — Telehealth: Payer: Self-pay | Admitting: *Deleted

## 2020-02-23 ENCOUNTER — Other Ambulatory Visit: Payer: Self-pay

## 2020-02-23 DIAGNOSIS — R079 Chest pain, unspecified: Secondary | ICD-10-CM

## 2020-02-23 DIAGNOSIS — R072 Precordial pain: Secondary | ICD-10-CM | POA: Insufficient documentation

## 2020-02-23 DIAGNOSIS — G4733 Obstructive sleep apnea (adult) (pediatric): Secondary | ICD-10-CM | POA: Diagnosis present

## 2020-02-23 DIAGNOSIS — R0789 Other chest pain: Secondary | ICD-10-CM | POA: Insufficient documentation

## 2020-02-23 DIAGNOSIS — I10 Essential (primary) hypertension: Secondary | ICD-10-CM | POA: Diagnosis present

## 2020-02-23 DIAGNOSIS — E1159 Type 2 diabetes mellitus with other circulatory complications: Secondary | ICD-10-CM | POA: Diagnosis not present

## 2020-02-23 DIAGNOSIS — Z006 Encounter for examination for normal comparison and control in clinical research program: Secondary | ICD-10-CM

## 2020-02-23 MED ORDER — DILTIAZEM HCL 25 MG/5ML IV SOLN
10.0000 mg | Freq: Once | INTRAVENOUS | Status: AC
  Filled 2020-02-23: qty 5

## 2020-02-23 MED ORDER — DILTIAZEM HCL 25 MG/5ML IV SOLN
INTRAVENOUS | Status: AC
  Administered 2020-02-23: 10 mg via INTRAVENOUS
  Filled 2020-02-23: qty 5

## 2020-02-23 MED ORDER — METOPROLOL TARTRATE 5 MG/5ML IV SOLN
5.0000 mg | INTRAVENOUS | Status: DC | PRN
  Administered 2020-02-23: 10:00:00 5 mg via INTRAVENOUS

## 2020-02-23 MED ORDER — NITROGLYCERIN 0.4 MG SL SUBL: 0.8000 mg | SUBLINGUAL_TABLET | Freq: Once | SUBLINGUAL | Status: DC

## 2020-02-23 MED ORDER — METOPROLOL TARTRATE 5 MG/5ML IV SOLN
INTRAVENOUS | Status: AC
  Administered 2020-02-23: 5 mg via INTRAVENOUS
  Filled 2020-02-23: qty 15

## 2020-02-23 NOTE — Research (Signed)
CADFEM Informed Consent                  Subject Name:   Mathew Ramsey. Mathew Ramsey   Subject met inclusion and exclusion criteria.  The informed consent form, study requirements and expectations were reviewed with the subject and questions and concerns were addressed prior to the signing of the consent form.  The subject verbalized understanding of the trial requirements.  The subject agreed to participate in the CADFEM trial and signed the informed consent.  The informed consent was obtained prior to performance of any protocol-specific procedures for the subject.  A copy of the signed informed consent was given to the subject and a copy was placed in the subject's medical record.   [place] Dal[place]o, Research Assistant  02/23/2020 08:05 a.m.

## 2020-02-23 NOTE — Progress Notes (Signed)
Patient was unable to complete the CT Cardiac study due to anxiety being put in to the CT scanner.  Patient was very polite and curteous but unable to go in to the scanner without becoming anxious and HR 90-98 after medications administered.  CT Navigator made aware and Dr. Harrell Gave, patient to be rescheduled.

## 2020-02-23 NOTE — Telephone Encounter (Signed)
Left sleep study appointment details with EC. Patient not available.

## 2020-02-24 NOTE — Progress Notes (Signed)
I guess we probably will get started back down.  We will probably switch to Myoview.   Glenetta Hew, MD

## 2020-03-08 ENCOUNTER — Ambulatory Visit: Payer: PRIVATE HEALTH INSURANCE | Admitting: Cardiology

## 2020-03-08 NOTE — Progress Notes (Deleted)
Primary Care Provider: Gregor Hams, FNP Cardiologist: No primary care provider on file. Electrophysiologist: None  Clinic Note: No chief complaint on file.   HPI:    Mathew Ramsey is a 54 y.o. male with a history of DM 2 (no insulin), hypertension, hyperlipidemia and OSA on CPAP who is being seen today for FOLLOW-UP evaluation of SHORTNESS OF BREATH ON EXERTION AND CHEST PAIN at the request of Zorita Pang, [place].  Mathew Ramsey was seen on January 02, 2020 evaluation of chest pain and exertional dyspnea.  He had previously been seen at [facility] cardiology as well seen to reestablish cardiology care based on his cardiac risk factors of hypertension, hyperlipidemia and diabetes as well as obesity and OSA (unable to tolerate CPAP). -->  Noted exertional dyspnea and intermittent sharp chest pain.   Evaluated coronary CTA --> converted to Myoview in August  Recent Hospitalizations: none  Reviewed  CV studies:    The following studies were reviewed today: (if available, images/films reviewed: From Epic Chart or Care Everywhere) . Echocardiogram (November 2018) reviewed, PSH updated. . Event monitor (November 2018) reviewed, essentially normal.  Interval History:   Mathew Ramsey is here today to   establish cardiology care.  He is concerned because of his cardiac risk factors of hypertension, hyperlipidemia and diabetes as well as OSA and obesity.    Mathew Ramsey is noted symptoms of shortness of breath with exertion has seems to gotten worse over the last several months since late 2020.  He also describes a sensation of sharp stabbing pain in his left upper chest that radiates to his left arm.  This is sometimes associated with exertion, not always.  He says sometimes there is sharpness but other times it is more like a pressure.  The symptoms do not last very long, but they tend to get worse if he does anything when they are happening.  They can occur at rest and  get worse with exertion.  He also describes occasional epigastric symptom that Goes up into his chest with fullness that often occurs after eating but sometimes with exertion as well.  He says he was diagnosed with OSA years ago but was not able to tolerate CPAP and really has not really been reevaluated in the last 5 years.  That was in [place].  If his heart rate is often fast, but not irregular or skipping.  CV Review of Symptoms (Summary) Cardiovascular ROS: positive for - chest pain, dyspnea on exertion, edema and rapid heart rate negative for - irregular heartbeat, orthopnea, palpitations, paroxysmal nocturnal dyspnea, shortness of breath or Syncope/near syncope, TIA/amaurosis fugax, claudication  The patient does not have symptoms concerning for COVID-19 infection (fever, chills, cough, or new shortness of breath).  The patient is practicing social distancing & Masking.   He has had both his COVID-19 vaccines  REVIEWED OF SYSTEMS   Review of Systems  Constitutional: Positive for malaise/fatigue (Does not have a lot of energy). Negative for weight loss.  HENT: Negative for congestion and nosebleeds.   Respiratory: Positive for shortness of breath (With exertion). Negative for cough.   Cardiovascular: Positive for leg swelling. Negative for claudication.  Gastrointestinal: Negative for abdominal pain, blood in stool and melena.  Genitourinary: Negative for frequency.  Musculoskeletal: Negative for joint pain and myalgias.  Neurological: Negative for dizziness, focal weakness and headaches.  Psychiatric/Behavioral: Negative for depression and memory loss. The patient is not nervous/anxious and does not have insomnia.  All other systems reviewed and are negative.  I have reviewed and (if needed) personally updated the patient's problem list, medications, allergies, past medical and surgical history, social and family history.   PAST MEDICAL HISTORY   Past Medical History:    Diagnosis Date  . Allergy   . Diabetes mellitus type II, uncontrolled ([place])   . Dyslipidemia   . Hyperlipidemia   . Hypertension   . Obesity   . OSA on CPAP     PAST SURGICAL HISTORY   Past Surgical History:  Procedure Laterality Date  . TRANSTHORACIC ECHOCARDIOGRAM  06/05/2017   [facility]: Mild concentric LVH.  EF 60 to 65%.  No R WMA.  GR 1 DD.  Normal RV size with mildly reduced function.  Unable assess RV pressures.  Grossly normal aortic valve.    MEDICATIONS/ALLERGIES   Current Meds  Medication Sig  . atorvastatin (LIPITOR) 40 MG tablet Take by mouth.  . benazepril-hydrochlorthiazide (LOTENSIN HCT) 20-12.5 MG tablet Take by mouth.  . Blood Glucose Monitoring Suppl (ONE TOUCH ULTRA 2) w/Device KIT Use to check blood sugar as directed  . fexofenadine-pseudoephedrine (ALLEGRA-D 24 HOUR) 180-240 MG per 24 hr tablet Take 1 tablet by mouth daily.  Marland Kitchen glucose blood test strip Use to check blood sugar 2 time(s) daily.  . Insulin Pen Needle (PEN NEEDLES) 31G X 6 MM MISC by Does not apply route.  . metFORMIN (GLUCOPHAGE-XR) 500 MG 24 hr tablet Take by mouth.  . pioglitazone-metformin (ACTOPLUS MET) 15-850 MG per tablet Take 1 tablet by mouth 2 (two) times daily with a meal.  . rosuvastatin (CRESTOR) 10 MG tablet Take 1 tablet (10 mg total) by mouth daily.  . sildenafil (REVATIO) 20 MG tablet Take 50MG (2 and a half tablets) to 100MG (5 tablets) 30 minutes prior to sex, no more than once per day.  . sildenafil (VIAGRA) 100 MG tablet Take 1 tablet (100 mg total) by mouth daily as needed for erectile dysfunction.  . sitaGLIPtin (JANUVIA) 100 MG tablet Take 100 mg by mouth daily.    Allergies  Allergen Reactions  . Sulfa Antibiotics Other (See Comments)    [name] Syndrome.    SOCIAL HISTORY/FAMILY HISTORY   Social History   Tobacco Use  . Smoking status: Former Smoker    Quit date: 04/12/2010    Years since quitting: 9.9  . Smokeless  tobacco: Never Used  Substance Use Topics  . Alcohol use: Yes    Alcohol/week: 2.0 standard drinks    Types: 2 drink(s) per week    Comment: beer on weekend  . Drug use: No   Social History   Social History Narrative   He was recently elected as the [employer] of [place]   family history includes Alcohol abuse in his father; Diabetes Mellitus II in his mother and sister; Heart disease in his brother; Heart failure in his brother and mother; Hyperlipidemia in his mother; Hypertension in his mother.   OBJCTIVE -PE, EKG, labs   Wt Readings from Last 3 Encounters:  01/02/20 265 lb (120.2 kg)  08/22/16 (!) 350 lb (158.8 kg)  10/15/14 (!) 371 lb 9.6 oz (168.6 kg)    Physical Exam: There were no vitals taken for this visit. Physical Exam Vitals reviewed.  Constitutional:      General: He is not in acute distress.    Appearance: He is well-developed.     Comments: Moderately obese gentleman with truncal obesity.  Well-groomed, healthy-appearing.  HENT:  Head: Normocephalic and atraumatic.  Neck:     Thyroid: No thyromegaly.     Vascular: No carotid bruit, hepatojugular reflux or JVD (Unable to assess).  Cardiovascular:     Rate and Rhythm: Regular rhythm. Tachycardia present.  No extrasystoles are present.    Chest Wall: PMI is not displaced (Unable to assess).     Pulses: Intact distal pulses.     Heart sounds: S1 normal and S2 normal. Heart sounds are distant. No murmur heard.  No friction rub. No gallop.   Pulmonary:     Effort: Pulmonary effort is normal. No respiratory distress.     Breath sounds: Normal breath sounds. No wheezing or rales.  Abdominal:     General: Bowel sounds are normal. There is no distension.     Palpations: Abdomen is soft.     Tenderness: There is no abdominal tenderness. There is no rebound.     Comments: Truncal obesity.  Unable to assess HSM.  Musculoskeletal:        General: Normal range of motion.     Cervical back:  Normal range of motion and neck supple.  Neurological:     Mental Status: He is alert and oriented to person, place, and time.     Cranial Nerves: No cranial nerve deficit.  Psychiatric:        Behavior: Behavior normal.        Thought Content: Thought content normal.        Judgment: Judgment normal.     Adult ECG Report  Rate: 104 ;  Rhythm: sinus tachycardia and Normal axis, intervals and durations.;   Narrative Interpretation: Other than tachycardia, normal EKG.  Recent Labs:    Lab Results  Component Value Date   CHOL 237 (H) 10/15/2014   HDL 53 10/15/2014   LDLCALC 158 (H) 10/15/2014   TRIG 129 10/15/2014   CHOLHDL 4.5 10/15/2014   Lab Results  Component Value Date   CREATININE 1.05 02/20/2020   BUN 13 02/20/2020   NA 136 02/20/2020   K 4.4 02/20/2020   CL 98 02/20/2020   CO2 23 02/20/2020   No results found for: TSH  ASSESSMENT/PLAN    Problem List Items Addressed This Visit    Hypertension associated with diabetes ([place]) (Chronic)   Hyperlipidemia associated with type 2 diabetes mellitus (H[facility]) (Chronic)   Chest pain radiating to arm (Chronic)   DOE (dyspnea on exertion) - Primary       COVID-19 Education: The signs and symptoms of COVID-19 were discussed with the patient and how to seek care for testing (follow up with PCP or arrange E-visit).   The importance of social distancing and COVID-19 vaccination was discussed today.  I spent a total of 62mnutes with the patient. >  50% of the time was spent in direct patient consultation.  Additional time spent with chart review  / charting (studies, outside notes, etc): 12 Total Time: 439m   Current medicines are reviewed at length with the patient today.  (+/- concerns) n/a  Notice: This dictation was prepared with Dragon dictation along with smaller phrase technology. Any transcriptional errors that result from this process are unintentional and may not be corrected upon review.  Patient Instructions /  Medication Changes & Studies & Tests Ordered   There are no Patient Instructions on file for this visit.   Studies Ordered:   No orders of the defined types were placed in this encounter.    DaGlenetta HewM.D., M.S. Interventional  Cardiologist   Pager # [phone] Phone # [phone] [address] [place], [place] [address]   Thank you for choosing Heartcare at [facility]!!

## 2020-03-09 ENCOUNTER — Ambulatory Visit (HOSPITAL_BASED_OUTPATIENT_CLINIC_OR_DEPARTMENT_OTHER): Payer: BC Managed Care – PPO | Attending: Cardiology | Admitting: Cardiovascular Disease

## 2020-03-09 ENCOUNTER — Other Ambulatory Visit: Payer: Self-pay

## 2020-03-09 DIAGNOSIS — G4733 Obstructive sleep apnea (adult) (pediatric): Secondary | ICD-10-CM | POA: Diagnosis not present

## 2020-03-15 ENCOUNTER — Encounter (HOSPITAL_BASED_OUTPATIENT_CLINIC_OR_DEPARTMENT_OTHER): Payer: Self-pay | Admitting: Cardiovascular Disease

## 2020-03-15 NOTE — Procedures (Signed)
Patient Name: Mathew Ramsey, Mathew Ramsey Date: 03/09/2020 Gender: Male D.O.B: 02/20/1966 Age (years): 53 Referring Provider: Glenetta Hew Height (inches): 11 Interpreting Physician: Shelva Majestic MD, ABSM Weight (lbs): 365 RPSGT: Lanae Boast BMI: 51 MRN: [id number] Neck Size: 18.00  CLINICAL INFORMATION Sleep Study Type: NPSG  Indication for sleep study: OSA, Snoring  Epworth Sleepiness Score: 8  SLEEP STUDY TECHNIQUE As per the AASM Manual for the Scoring of Sleep and Associated Events v2.3 (April 2016) with a hypopnea requiring 4% desaturations.  The channels recorded and monitored were frontal, central and occipital EEG, electrooculogram (EOG), submentalis EMG (chin), nasal and oral airflow, thoracic and abdominal wall motion, anterior tibialis EMG, snore microphone, electrocardiogram, and pulse oximetry.  MEDICATIONS atorvastatin (LIPITOR) 40 MG tablet benazepril-hydrochlorthiazide (LOTENSIN HCT) 20-12.5 MG tablet Blood Glucose Monitoring Suppl (ONE TOUCH ULTRA 2) w/Device KIT fexofenadine-pseudoephedrine (ALLEGRA-D 24 HOUR) 180-240 MG per 24 hr tablet glucose blood test strip Insulin Pen Needle (PEN NEEDLES) 31G X 6 MM MISC lansoprazole (PREVACID) 15 MG capsule metFORMIN (GLUCOPHAGE-XR) 500 MG 24 hr tablet metoprolol tartrate (LOPRESSOR) 50 MG tablet(Expired) ONETOUCH ULTRA test strip OZEMPIC, 1 MG/DOSE, 4 MG/3ML SOPN pioglitazone-metformin (ACTOPLUS MET) 15-850 MG per tablet rosuvastatin (CRESTOR) 10 MG tablet sildenafil (REVATIO) 20 MG tablet sildenafil (VIAGRA) 100 MG tablet sitaGLIPtin (JANUVIA) 100 MG tablet Medications self-administered by patient taken the night of the study : [employer] The study was initiated at 11:18:22 PM and ended at 5:26:58 AM.  Sleep onset time was 11.5 minutes and the sleep efficiency was 57.8%. The total sleep time was 213.1 minutes.  Stage REM latency was N/A minutes.  The patient spent 33.85%  of the night in stage N1 sleep, 64.27% in stage N2 sleep, 1.88% in stage N3 and 0% in REM.  Alpha intrusion was absent.  Supine sleep was 58.64%.  RESPIRATORY PARAMETERS The overall apnea/hypopnea index (AHI) was 55.2 per hour.  The respiratory disturbance index (RDI) was 61.9/h. There were 122 total apneas, including 122 obstructive, 0 central and 0 mixed apneas. There were 74 hypopneas and 24 RERAs.  The AHI during Stage REM sleep was N/A per hour.  AHI while supine was 40.3 per hour.  The mean oxygen saturation was 92.96%. The minimum SpO2 during sleep was 75.00%.  Loud snoring was noted during this study.  CARDIAC DATA The 2 lead EKG demonstrated sinus rhythm. The mean heart rate was 91 beats per minute. Other EKG findings include: None.  LEG MOVEMENT DATA The total PLMS were 0 with a resulting PLMS index of 0.00. Associated arousal with leg movement index was 0.0 .  IMPRESSIONS - Severe obstructive sleep apnea occurred during this study (AHI 55.2/h; RDI 61.9/h) with absent REM sleep.  - No significant central sleep apnea occurred during this study (CAI = 0.0/h). - Significant  oxygen desaturation to a nadir of 75%. - The patient snored with loud snoring volume. - No cardiac abnormalities were noted during this study. - Clinically significant periodic limb movements did not occur during sleep. No significant associated arousals.  DIAGNOSIS - Obstructive Sleep Apnea (G47.33) - Nocturnal Hypoxemia (G47.36)  RECOMMENDATIONS - In this patient with severe sleep apnea despite absent REM sleep and significant oxygen desaturation recommend and in-lab therapeutic CPAP titration study to determine optimal pressure required to alleviate sleep disordered breathing. - Effort should be made to optimize nasal and oropharyngeal patency.  - Avoid alcohol, sedatives and other CNS depressants that may worsen sleep apnea and disrupt normal sleep architecture. - Sleep hygiene  should be reviewed  to assess factors that may improve sleep quality. - Weight management (BMI 51) and regular exercise should be initiated or continued if appropriate.  [Electronically signed] 03/15/2020 09:11 AM  Shelva Majestic MD, [facility], [place], American Board of Sleep Medicine   NPI: [id number] [place] PH: [phone]   FX: [phone] [place]

## 2020-03-25 ENCOUNTER — Other Ambulatory Visit: Payer: Self-pay | Admitting: Cardiovascular Disease

## 2020-03-25 DIAGNOSIS — IMO0002 Reserved for concepts with insufficient information to code with codable children: Secondary | ICD-10-CM

## 2020-03-25 DIAGNOSIS — G4733 Obstructive sleep apnea (adult) (pediatric): Secondary | ICD-10-CM

## 2020-03-30 ENCOUNTER — Telehealth: Payer: Self-pay | Admitting: *Deleted

## 2020-03-30 NOTE — Telephone Encounter (Signed)
Left message I called to discuss sleep study results and recommendations. I will call back at a later time.

## 2020-03-31 NOTE — Telephone Encounter (Signed)
Attempted to contact patient to inform him of his sleep study results and recommendations. Mailbox full and cannot leave a message.

## 2020-04-01 ENCOUNTER — Encounter: Payer: Self-pay | Admitting: *Deleted

## 2020-04-01 ENCOUNTER — Telehealth: Payer: Self-pay | Admitting: *Deleted

## 2020-04-01 NOTE — Telephone Encounter (Signed)
Left message called again to discuss sleep study. I will try again mid day.

## 2020-04-01 NOTE — Telephone Encounter (Signed)
After several unsuccessful attempts to contact patient by phone, a letter has been sent to him informing him of his sleep study results and recommendations.

## 2020-05-03 ENCOUNTER — Encounter (HOSPITAL_BASED_OUTPATIENT_CLINIC_OR_DEPARTMENT_OTHER): Payer: PRIVATE HEALTH INSURANCE | Admitting: Cardiovascular Disease

## 2020-11-30 ENCOUNTER — Other Ambulatory Visit: Payer: Self-pay

## 2020-11-30 ENCOUNTER — Ambulatory Visit (INDEPENDENT_AMBULATORY_CARE_PROVIDER_SITE_OTHER): Payer: BC Managed Care – PPO | Admitting: Plastic Surgery

## 2020-11-30 ENCOUNTER — Encounter: Payer: Self-pay | Admitting: Plastic Surgery

## 2020-11-30 DIAGNOSIS — D171 Benign lipomatous neoplasm of skin and subcutaneous tissue of trunk: Secondary | ICD-10-CM

## 2020-11-30 NOTE — Progress Notes (Signed)
Patient ID: Mathew Ramsey, male    DOB: 16-Jan-1966, 55 y.o.   MRN: [id number]   Chief Complaint  Patient presents with  . Advice Only    The patient is a 55 year old male here for evaluation of his abdomen.  The patient states that he noticed a mass on his abdomen many years ago.  Over the last year it has gotten larger.  He notices it now through his shirt.  It is tender to touch.  He finds that it actually hurts when he sits up and at this time his stomach pushes against it.  Its becoming tender and larger.  He does have diabetes, hyperlipidemia and hypertension.  He quit smoking over 10 years ago.  He does not have a family history of cancer.  He is 89 and weighs 345 pounds.  He has decreased his weight significantly over the past year with portion control.  The mass is approximately 15 cm in size.  It is tender to touch.  It is noticeable in pictures.  Those are in the chart.  His last hemoglobin A1c was 7.8 in March.  He had an ultrasound in August 2021 which showed a possible lipoma.  He was not found to have a cystic or solid lesion.   Review of Systems  Constitutional: Negative.   HENT: Negative.   Eyes: Negative.   Respiratory: Negative.   Cardiovascular: Negative.  Negative for leg swelling.  Gastrointestinal: Positive for abdominal pain.  Genitourinary: Negative.   Musculoskeletal: Negative.   Hematological: Negative.   Psychiatric/Behavioral: Negative.     Past Medical History:  Diagnosis Date  . Allergy   . Diabetes mellitus type II, uncontrolled ([place])   . Dyslipidemia   . Hyperlipidemia   . Hypertension   . Obesity   . OSA on CPAP     Past Surgical History:  Procedure Laterality Date  . TRANSTHORACIC ECHOCARDIOGRAM  06/05/2017   [facility]: Mild concentric LVH.  EF 60 to 65%.  No R WMA.  GR 1 DD.  Normal RV size with mildly reduced function.  Unable assess RV pressures.  Grossly normal aortic valve.      Current Outpatient  Medications:  .  atorvastatin (LIPITOR) 40 MG tablet, Take by mouth., Disp: , Rfl:  .  benazepril-hydrochlorthiazide (LOTENSIN HCT) 20-12.5 MG tablet, Take by mouth., Disp: , Rfl:  .  Blood Glucose Monitoring Suppl (ONE TOUCH ULTRA 2) w/Device KIT, Use to check blood sugar as directed, Disp: , Rfl:  .  fexofenadine-pseudoephedrine (ALLEGRA-D 24 HOUR) 180-240 MG per 24 hr tablet, Take 1 tablet by mouth daily., Disp: 30 tablet, Rfl: 5 .  Insulin Pen Needle (PEN NEEDLES) 31G X 6 MM MISC, by Does not apply route., Disp: , Rfl:  .  lansoprazole (PREVACID) 15 MG capsule, Take by mouth., Disp: , Rfl:  .  metFORMIN (GLUCOPHAGE-XR) 500 MG 24 hr tablet, Take by mouth., Disp: , Rfl:  .  ONETOUCH ULTRA test strip, 1 each 2 (two) times daily., Disp: , Rfl:  .  OZEMPIC, 1 MG/DOSE, 4 MG/3ML SOPN, , Disp: , Rfl:  .  pioglitazone-metformin (ACTOPLUS MET) 15-850 MG per tablet, Take 1 tablet by mouth 2 (two) times daily with a meal., Disp: 180 tablet, Rfl: 1 .  rosuvastatin (CRESTOR) 10 MG tablet, Take 1 tablet (10 mg total) by mouth daily., Disp: 90 tablet, Rfl: 3 .  sitaGLIPtin (JANUVIA) 100 MG tablet, Take 100 mg by mouth daily., Disp: , Rfl:  .  metoprolol tartrate (LOPRESSOR) 50 MG tablet, Take 2 tablets (100 mg total) by mouth once for 1 dose. TAKE TWO HOURS PRIOR TO  SCHEDULE CARDIAC TEST, Disp: 2 tablet, Rfl: 0 .  sildenafil (REVATIO) 20 MG tablet, Take 50MG (2 and a half tablets) to 100MG (5 tablets) 30 minutes prior to sex, no more than once per day., Disp: , Rfl:  .  sildenafil (VIAGRA) 100 MG tablet, Take 1 tablet (100 mg total) by mouth daily as needed for erectile dysfunction., Disp: 10 tablet, Rfl: 5   Objective:   Vitals:   11/30/20 1213  BP: 137/84  Pulse: (!) 107  SpO2: 98%    Physical Exam Vitals and nursing note reviewed.  Constitutional:      Appearance: Normal appearance.  HENT:     Head: Normocephalic and atraumatic.  Cardiovascular:     Rate and Rhythm: Normal rate.     Pulses:  Normal pulses.  Pulmonary:     Effort: Pulmonary effort is normal. No respiratory distress.     Breath sounds: No wheezing.  Abdominal:     General: Abdomen is flat. There is no distension.     Palpations: There is mass.     Tenderness: There is abdominal tenderness. There is no guarding.    Skin:    General: Skin is warm.     Capillary Refill: Capillary refill takes less than 2 seconds.  Neurological:     General: No focal deficit present.     Mental Status: He is alert and oriented to person, place, and time.  Psychiatric:        Mood and Affect: Mood normal.        Behavior: Behavior normal.        Thought Content: Thought content normal.     Assessment & Plan:  Lipoma of torso  Recommend excision of abdominal lipoma.  More comfortable having this removed in the operating room.  He agrees.  He will have a scar.  Pictures were obtained of the patient and placed in the chart with the patient's or guardian's permission.  [place], DO

## 2020-12-28 ENCOUNTER — Other Ambulatory Visit: Payer: Self-pay | Admitting: Cardiology

## 2020-12-28 DIAGNOSIS — I152 Hypertension secondary to endocrine disorders: Secondary | ICD-10-CM

## 2020-12-28 DIAGNOSIS — R072 Precordial pain: Secondary | ICD-10-CM

## 2020-12-28 DIAGNOSIS — G4733 Obstructive sleep apnea (adult) (pediatric): Secondary | ICD-10-CM

## 2020-12-28 DIAGNOSIS — R0789 Other chest pain: Secondary | ICD-10-CM

## 2021-01-18 ENCOUNTER — Ambulatory Visit (INDEPENDENT_AMBULATORY_CARE_PROVIDER_SITE_OTHER): Payer: BC Managed Care – PPO | Admitting: Surgical

## 2021-01-18 ENCOUNTER — Telehealth: Payer: Self-pay | Admitting: *Deleted

## 2021-01-18 ENCOUNTER — Encounter: Payer: Self-pay | Admitting: Surgical

## 2021-01-18 ENCOUNTER — Other Ambulatory Visit: Payer: Self-pay

## 2021-01-18 VITALS — BP 129/84 | HR 102 | Ht 71.0 in | Wt 346.0 lb

## 2021-01-18 DIAGNOSIS — D171 Benign lipomatous neoplasm of skin and subcutaneous tissue of trunk: Secondary | ICD-10-CM

## 2021-01-18 MED ORDER — ONDANSETRON HCL 4 MG PO TABS: 4.0000 mg | ORAL_TABLET | Freq: Three times a day (TID) | ORAL | 0 refills | Status: DC | PRN

## 2021-01-18 MED ORDER — CEPHALEXIN 500 MG PO CAPS: 500.0000 mg | ORAL_CAPSULE | Freq: Four times a day (QID) | ORAL | 0 refills | Status: AC

## 2021-01-18 MED ORDER — TRAMADOL HCL 50 MG PO TABS: 50.0000 mg | ORAL_TABLET | Freq: Three times a day (TID) | ORAL | 0 refills | Status: AC | PRN

## 2021-01-18 NOTE — Telephone Encounter (Signed)
   [facility] Pre-operative Risk Assessment    Patient Name: Mathew Ramsey  DOB: 04-28-1966  MRN: [id number]     Request for surgical clearance:  What type of surgery is being performed? Excision  of Abdominal Lipoma ( length surgery :1 hour )  When is this surgery scheduled? 02/03/21   ( please return completed information by 01/24/21)   What type of clearance is required (medical clearance vs. Pharmacy clearance to hold med vs. Both)? Medical  Are there any medications that need to be held prior to surgery and how long?n/a  Practice name and name of physician performing surgery?  [facility] ; Dr Audelia Hives  What is the office phone number? [phone] attn Mathew Scheeler,PA-C   7.   What is the office fax number? [place]  attn Mathew Scheeler,PA-C   8.   Anesthesia type (None, local, MAC, general) ? General    Mathew Ramsey 01/18/2021, 3:23 PM  _________________________________________________________________   (provider comments below)

## 2021-01-18 NOTE — Progress Notes (Signed)
Patient ID: Mathew Ramsey, male    DOB: 06/21/1966, 55 y.o.   MRN: [id number]  Chief Complaint  Patient presents with   Follow-up       ICD-10-CM   1. Lipoma of torso  D17.1        History of Present Illness: Mathew Ramsey is a 55 y.o.  male  with a history of abdomen mass.  He presents for preoperative evaluation for upcoming procedure, excision of abdominal lipoma, scheduled for 02/03/2021 with Dr. Marla Roe.  The patient has not had problems with anesthesia. No history of DVT/PE.  No family history of DVT/PE.  No family or personal history of bleeding or clotting disorders.  Patient is not currently taking any blood thinners.  No history of CVA/MI.   Summary of Previous Visit: Patient states he noticed a mass in his abdomen many years ago.  Over the last year it is gotten larger.  It is tender to touch.  He quit smoking 10 years ago.  The mass is approximately 15 cm in size.  His last hemoglobin A1c was 7.8 in March.  He had an ultrasound in August 2021 which showed a possible lipoma.  PMH Significant for: Hypertension, sleep apnea, obesity, hyperlipidemia Most recent A1c 2 weeks ago was 7.  Patient reports he is doing well.  No recent changes in his health.  Past Medical History: Allergies: Allergies  Allergen Reactions   Sulfa Antibiotics Other (See Comments)    [name] Syndrome.    Current Medications:  Current Outpatient Medications:    atorvastatin (LIPITOR) 40 MG tablet, Take by mouth., Disp: , Rfl:    benazepril-hydrochlorthiazide (LOTENSIN HCT) 20-12.5 MG tablet, Take by mouth., Disp: , Rfl:    Blood Glucose Monitoring Suppl (ONE TOUCH ULTRA 2) w/Device KIT, Use to check blood sugar as directed, Disp: , Rfl:    cephALEXin (KEFLEX) 500 MG capsule, Take 1 capsule (500 mg total) by mouth 4 (four) times daily for 3 days., Disp: 12 capsule, Rfl: 0   fexofenadine-pseudoephedrine (ALLEGRA-D 24 HOUR) 180-240 MG per 24 hr tablet, Take 1 tablet by mouth daily.,  Disp: 30 tablet, Rfl: 5   Insulin Pen Needle (PEN NEEDLES) 31G X 6 MM MISC, by Does not apply route., Disp: , Rfl:    lansoprazole (PREVACID) 15 MG capsule, Take by mouth., Disp: , Rfl:    metFORMIN (GLUCOPHAGE-XR) 500 MG 24 hr tablet, Take by mouth., Disp: , Rfl:    ondansetron (ZOFRAN) 4 MG tablet, Take 1 tablet (4 mg total) by mouth every 8 (eight) hours as needed for nausea or vomiting., Disp: 20 tablet, Rfl: 0   ONETOUCH ULTRA test strip, 1 each 2 (two) times daily., Disp: , Rfl:    OZEMPIC, 1 MG/DOSE, 4 MG/3ML SOPN, , Disp: , Rfl:    pioglitazone-metformin (ACTOPLUS MET) 15-850 MG per tablet, Take 1 tablet by mouth 2 (two) times daily with a meal., Disp: 180 tablet, Rfl: 1   rosuvastatin (CRESTOR) 10 MG tablet, Take 1 tablet (10 mg total) by mouth daily., Disp: 90 tablet, Rfl: 3   sildenafil (REVATIO) 20 MG tablet, Take 50MG (2 and a half tablets) to 100MG (5 tablets) 30 minutes prior to sex, no more than once per day., Disp: , Rfl:    sildenafil (VIAGRA) 100 MG tablet, Take 1 tablet (100 mg total) by mouth daily as needed for erectile dysfunction., Disp: 10 tablet, Rfl: 5   sitaGLIPtin (JANUVIA) 100 MG tablet, Take 100 mg by mouth  daily., Disp: , Rfl:    traMADol (ULTRAM) 50 MG tablet, Take 1 tablet (50 mg total) by mouth every 8 (eight) hours as needed for up to 5 days for severe pain., Disp: 15 tablet, Rfl: 0   metoprolol tartrate (LOPRESSOR) 50 MG tablet, Take 2 tablets (100 mg total) by mouth once for 1 dose. TAKE TWO HOURS PRIOR TO  SCHEDULE CARDIAC TEST, Disp: 2 tablet, Rfl: 0  Past Medical Problems: Past Medical History:  Diagnosis Date   Allergy    Diabetes mellitus type II, uncontrolled ([place])    Dyslipidemia    Hyperlipidemia    Hypertension    Obesity    OSA on CPAP     Past Surgical History: Past Surgical History:  Procedure Laterality Date   TRANSTHORACIC ECHOCARDIOGRAM  06/05/2017   [facility]: Mild concentric LVH.  EF 60 to 65%.  No R WMA.   GR 1 DD.  Normal RV size with mildly reduced function.  Unable assess RV pressures.  Grossly normal aortic valve.    Social History: Social History   Socioeconomic History   Marital status: Married    Spouse name: Not on file   Number of children: Not on file   Years of education: Not on file   Highest education level: Not on file  Occupational History    Employer: [employer]    Comment: Scientist, research (physical sciences)  Tobacco Use   Smoking status: Former    Pack years: 0.00    Types: Cigarettes    Quit date: 04/12/2010    Years since quitting: 10.7   Smokeless tobacco: Never  Substance and Sexual Activity   Alcohol use: Yes    Alcohol/week: 2.0 standard drinks    Types: 2 drink(s) per week    Comment: beer on weekend   Drug use: No   Sexual activity: Yes  Other Topics Concern   Not on file  Social History Narrative   He was recently elected as the [employer]   Social Determinants of Health   Financial Resource Strain: Not on file  Food Insecurity: Not on file  Transportation Needs: Not on file  Physical Activity: Not on file  Stress: Not on file  Social Connections: Not on file  Intimate Partner Violence: Not on file    Family History: Family History  Problem Relation Age of Onset   Diabetes Mellitus II Mother    Hypertension Mother    Hyperlipidemia Mother    Heart failure Mother    Alcohol abuse Father    Diabetes Mellitus II Sister    Heart disease Brother        Not aware of details. --By report has a pacemaker.   Heart failure Brother     Review of Systems: Review of Systems  Constitutional: Negative.   Cardiovascular: Negative.   Gastrointestinal: Negative.   Neurological: Negative.    Physical Exam: Vital Signs BP 129/84 (BP Location: Right Arm, Patient Position: Sitting, Cuff Size: Large)   Pulse (!) 102   Ht $R'5\' 11"'xD$  (1.803 m)   Wt (!) 346 lb (156.9 kg)   SpO2 95%   BMI 48.26 kg/m   Physical Exam   Constitutional:      General: Not in acute distress.    Appearance: Normal appearance. Not ill-appearing.  HENT:     Head: Normocephalic and atraumatic.  Eyes:     Pupils: Pupils are equal, round Neck:     Musculoskeletal: Normal range of motion.  Cardiovascular:     Rate and Rhythm: Normal rate    Pulses: Normal pulses.  Pulmonary:     Effort: Pulmonary effort is normal. No respiratory distress.  Abdominal:     General: Abdomen is flat. There is no distension.  Musculoskeletal: Normal range of motion.  Skin:    General: Skin is warm and dry.     Findings: No erythema or rash.  Neurological:     General: No focal deficit present.     Mental Status: Alert and oriented to person, place, and time. Mental status is at baseline.     Motor: No weakness.  Psychiatric:        Mood and Affect: Mood normal.        Behavior: Behavior normal.    Assessment/Plan: The patient is scheduled for Excision of abdominal lipoma with Dr. Marla Roe.  Risks, benefits, and alternatives of procedure discussed, questions answered and consent obtained.    Smoking Status: quit 12 yrs ago; Counseling Given? N/A  Caprini Score: 4, moderate; Risk Factors include: Age, BMI greater than 40, and length of planned surgery. Recommendation for mechanical and pharmacological prophylaxis while hospitalized. Encourage early ambulation.   Pictures obtained:_0   Post-op Rx sent to pharmacy: Tramadol, Zofran, Keflex  Patient was provided with the General Surgical Risk consent document and Pain Medication Agreement prior to their appointment.  They had adequate time to read through the risk consent documents and Pain Medication Agreement. We also discussed them in person together during this preop appointment. All of their questions were answered to their satisfaction.  Recommended calling if they have any further questions.  Risk consent form and Pain Medication Agreement to be scanned into patient's  chart.  Patient is aware of increased risks of postoperative healing complications given his chronic conditions including diabetes.  We have sent medical clearance to cardiology.  Patient is scheduled to travel to [facility] a few days after surgery to spend time there.  He is aware after today's discussion that he will not be able to submerge the incision in the pool or ocean.  He is aware of postoperative restrictions including heavy lifting or strenuous activities.  He is aware to not expose the incision to the sun to prevent abnormal scarring discoloration.  Electronically signed by: Carola Rhine Capricia Serda, PA-C 01/18/2021 10:17 AM

## 2021-01-18 NOTE — Telephone Encounter (Signed)
   Name: Mathew Ramsey  DOB: 03-05-1966  MRN: [id number]  Primary Cardiologist: Dr. Ellyn Hack   Chart reviewed as part of pre-operative protocol coverage. Patient has not been seen in over 1 year. Last visit was 01/02/2020. Because of Mathew Ramsey's past medical history and time since last visit, he will require a follow-up visit in order to better assess preoperative cardiovascular risk.  Pre-op covering staff: - Please schedule appointment and call patient to inform them. If patient already had an upcoming appointment within acceptable timeframe, please add "pre-op clearance" to the appointment notes so provider is aware. - Please contact requesting surgeon's office via preferred method (i.e, phone, fax) to inform them of need for appointment prior to surgery.  If applicable, this message will also be routed to pharmacy pool and/or primary cardiologist for input on holding anticoagulant/antiplatelet agent as requested below so that this information is available to the clearing provider at time of patient's appointment.   Darreld Mclean, PA-C  01/18/2021, 4:05 PM

## 2021-01-18 NOTE — Telephone Encounter (Signed)
Called pt and offered appt with Dr. Ellyn Hack for pre op clearance 01/21/21 at 3 pm, ok per Ivin Booty Dr. Allison Quarry nurse to use time slot. Pt accepted appt. Will send notes to MD for upcoming appt. Will send FYI to surgeon's office pt has appt 01/21/21 for pre op assessment. Pt thanked me for the help.

## 2021-01-18 NOTE — H&P (View-Only) (Signed)
Patient ID: Mathew Ramsey, male    DOB: 06/21/1966, 55 y.o.   MRN: [id number]  Chief Complaint  Patient presents with   Follow-up       ICD-10-CM   1. Lipoma of torso  D17.1        History of Present Illness: Mathew Ramsey is a 55 y.o.  male  with a history of abdomen mass.  He presents for preoperative evaluation for upcoming procedure, excision of abdominal lipoma, scheduled for 02/03/2021 with Dr. Marla Roe.  The patient has not had problems with anesthesia. No history of DVT/PE.  No family history of DVT/PE.  No family or personal history of bleeding or clotting disorders.  Patient is not currently taking any blood thinners.  No history of CVA/MI.   Summary of Previous Visit: Patient states he noticed a mass in his abdomen many years ago.  Over the last year it is gotten larger.  It is tender to touch.  He quit smoking 10 years ago.  The mass is approximately 15 cm in size.  His last hemoglobin A1c was 7.8 in March.  He had an ultrasound in August 2021 which showed a possible lipoma.  PMH Significant for: Hypertension, sleep apnea, obesity, hyperlipidemia Most recent A1c 2 weeks ago was 7.  Patient reports he is doing well.  No recent changes in his health.  Past Medical History: Allergies: Allergies  Allergen Reactions   Sulfa Antibiotics Other (See Comments)    [name] Syndrome.    Current Medications:  Current Outpatient Medications:    atorvastatin (LIPITOR) 40 MG tablet, Take by mouth., Disp: , Rfl:    benazepril-hydrochlorthiazide (LOTENSIN HCT) 20-12.5 MG tablet, Take by mouth., Disp: , Rfl:    Blood Glucose Monitoring Suppl (ONE TOUCH ULTRA 2) w/Device KIT, Use to check blood sugar as directed, Disp: , Rfl:    cephALEXin (KEFLEX) 500 MG capsule, Take 1 capsule (500 mg total) by mouth 4 (four) times daily for 3 days., Disp: 12 capsule, Rfl: 0   fexofenadine-pseudoephedrine (ALLEGRA-D 24 HOUR) 180-240 MG per 24 hr tablet, Take 1 tablet by mouth daily.,  Disp: 30 tablet, Rfl: 5   Insulin Pen Needle (PEN NEEDLES) 31G X 6 MM MISC, by Does not apply route., Disp: , Rfl:    lansoprazole (PREVACID) 15 MG capsule, Take by mouth., Disp: , Rfl:    metFORMIN (GLUCOPHAGE-XR) 500 MG 24 hr tablet, Take by mouth., Disp: , Rfl:    ondansetron (ZOFRAN) 4 MG tablet, Take 1 tablet (4 mg total) by mouth every 8 (eight) hours as needed for nausea or vomiting., Disp: 20 tablet, Rfl: 0   ONETOUCH ULTRA test strip, 1 each 2 (two) times daily., Disp: , Rfl:    OZEMPIC, 1 MG/DOSE, 4 MG/3ML SOPN, , Disp: , Rfl:    pioglitazone-metformin (ACTOPLUS MET) 15-850 MG per tablet, Take 1 tablet by mouth 2 (two) times daily with a meal., Disp: 180 tablet, Rfl: 1   rosuvastatin (CRESTOR) 10 MG tablet, Take 1 tablet (10 mg total) by mouth daily., Disp: 90 tablet, Rfl: 3   sildenafil (REVATIO) 20 MG tablet, Take 50MG (2 and a half tablets) to 100MG (5 tablets) 30 minutes prior to sex, no more than once per day., Disp: , Rfl:    sildenafil (VIAGRA) 100 MG tablet, Take 1 tablet (100 mg total) by mouth daily as needed for erectile dysfunction., Disp: 10 tablet, Rfl: 5   sitaGLIPtin (JANUVIA) 100 MG tablet, Take 100 mg by mouth  daily., Disp: , Rfl:    traMADol (ULTRAM) 50 MG tablet, Take 1 tablet (50 mg total) by mouth every 8 (eight) hours as needed for up to 5 days for severe pain., Disp: 15 tablet, Rfl: 0   metoprolol tartrate (LOPRESSOR) 50 MG tablet, Take 2 tablets (100 mg total) by mouth once for 1 dose. TAKE TWO HOURS PRIOR TO  SCHEDULE CARDIAC TEST, Disp: 2 tablet, Rfl: 0  Past Medical Problems: Past Medical History:  Diagnosis Date   Allergy    Diabetes mellitus type II, uncontrolled ([place])    Dyslipidemia    Hyperlipidemia    Hypertension    Obesity    OSA on CPAP     Past Surgical History: Past Surgical History:  Procedure Laterality Date   TRANSTHORACIC ECHOCARDIOGRAM  06/05/2017   [facility]: Mild concentric LVH.  EF 60 to 65%.  No R WMA.   GR 1 DD.  Normal RV size with mildly reduced function.  Unable assess RV pressures.  Grossly normal aortic valve.    Social History: Social History   Socioeconomic History   Marital status: Married    Spouse name: Not on file   Number of children: Not on file   Years of education: Not on file   Highest education level: Not on file  Occupational History    Employer: [employer]    Comment: Scientist, research (physical sciences)  Tobacco Use   Smoking status: Former    Pack years: 0.00    Types: Cigarettes    Quit date: 04/12/2010    Years since quitting: 10.7   Smokeless tobacco: Never  Substance and Sexual Activity   Alcohol use: Yes    Alcohol/week: 2.0 standard drinks    Types: 2 drink(s) per week    Comment: beer on weekend   Drug use: No   Sexual activity: Yes  Other Topics Concern   Not on file  Social History Narrative   He was recently elected as the [employer]   Social Determinants of Health   Financial Resource Strain: Not on file  Food Insecurity: Not on file  Transportation Needs: Not on file  Physical Activity: Not on file  Stress: Not on file  Social Connections: Not on file  Intimate Partner Violence: Not on file    Family History: Family History  Problem Relation Age of Onset   Diabetes Mellitus II Mother    Hypertension Mother    Hyperlipidemia Mother    Heart failure Mother    Alcohol abuse Father    Diabetes Mellitus II Sister    Heart disease Brother        Not aware of details. --By report has a pacemaker.   Heart failure Brother     Review of Systems: Review of Systems  Constitutional: Negative.   Cardiovascular: Negative.   Gastrointestinal: Negative.   Neurological: Negative.    Physical Exam: Vital Signs BP 129/84 (BP Location: Right Arm, Patient Position: Sitting, Cuff Size: Large)   Pulse (!) 102   Ht $R'5\' 11"'tm$  (1.803 m)   Wt (!) 346 lb (156.9 kg)   SpO2 95%   BMI 48.26 kg/m   Physical Exam   Constitutional:      General: Not in acute distress.    Appearance: Normal appearance. Not ill-appearing.  HENT:     Head: Normocephalic and atraumatic.  Eyes:     Pupils: Pupils are equal, round Neck:     Musculoskeletal: Normal range of motion.  Cardiovascular:     Rate and Rhythm: Normal rate    Pulses: Normal pulses.  Pulmonary:     Effort: Pulmonary effort is normal. No respiratory distress.  Abdominal:     General: Abdomen is flat. There is no distension.  Musculoskeletal: Normal range of motion.  Skin:    General: Skin is warm and dry.     Findings: No erythema or rash.  Neurological:     General: No focal deficit present.     Mental Status: Alert and oriented to person, place, and time. Mental status is at baseline.     Motor: No weakness.  Psychiatric:        Mood and Affect: Mood normal.        Behavior: Behavior normal.    Assessment/Plan: The patient is scheduled for Excision of abdominal lipoma with Dr. Marla Roe.  Risks, benefits, and alternatives of procedure discussed, questions answered and consent obtained.    Smoking Status: quit 12 yrs ago; Counseling Given? N/A  Caprini Score: 4, moderate; Risk Factors include: Age, BMI greater than 40, and length of planned surgery. Recommendation for mechanical and pharmacological prophylaxis while hospitalized. Encourage early ambulation.   Pictures obtained:_0   Post-op Rx sent to pharmacy: Tramadol, Zofran, Keflex  Patient was provided with the General Surgical Risk consent document and Pain Medication Agreement prior to their appointment.  They had adequate time to read through the risk consent documents and Pain Medication Agreement. We also discussed them in person together during this preop appointment. All of their questions were answered to their satisfaction.  Recommended calling if they have any further questions.  Risk consent form and Pain Medication Agreement to be scanned into patient's  chart.  Patient is aware of increased risks of postoperative healing complications given his chronic conditions including diabetes.  We have sent medical clearance to cardiology.  Patient is scheduled to travel to [facility] a few days after surgery to spend time there.  He is aware after today's discussion that he will not be able to submerge the incision in the pool or ocean.  He is aware of postoperative restrictions including heavy lifting or strenuous activities.  He is aware to not expose the incision to the sun to prevent abnormal scarring discoloration.  Electronically signed by: Carola Rhine Kinzy Weyers, PA-C 01/18/2021 10:17 AM

## 2021-01-21 ENCOUNTER — Encounter (HOSPITAL_BASED_OUTPATIENT_CLINIC_OR_DEPARTMENT_OTHER): Payer: Self-pay | Admitting: Plastic Surgery

## 2021-01-21 ENCOUNTER — Ambulatory Visit: Payer: BC Managed Care – PPO | Admitting: Cardiology

## 2021-01-21 ENCOUNTER — Encounter: Payer: Self-pay | Admitting: Cardiology

## 2021-01-21 ENCOUNTER — Other Ambulatory Visit: Payer: Self-pay

## 2021-01-21 VITALS — BP 128/78 | HR 115 | Resp 18 | Ht 71.0 in | Wt 339.0 lb

## 2021-01-21 DIAGNOSIS — E1169 Type 2 diabetes mellitus with other specified complication: Secondary | ICD-10-CM | POA: Diagnosis not present

## 2021-01-21 DIAGNOSIS — I152 Hypertension secondary to endocrine disorders: Secondary | ICD-10-CM

## 2021-01-21 DIAGNOSIS — Z01810 Encounter for preprocedural cardiovascular examination: Secondary | ICD-10-CM

## 2021-01-21 DIAGNOSIS — G4733 Obstructive sleep apnea (adult) (pediatric): Secondary | ICD-10-CM

## 2021-01-21 DIAGNOSIS — E785 Hyperlipidemia, unspecified: Secondary | ICD-10-CM

## 2021-01-21 DIAGNOSIS — E1159 Type 2 diabetes mellitus with other circulatory complications: Secondary | ICD-10-CM | POA: Diagnosis not present

## 2021-01-21 DIAGNOSIS — R0789 Other chest pain: Secondary | ICD-10-CM

## 2021-01-21 NOTE — Progress Notes (Signed)
   01/21/21 1423  OBSTRUCTIVE SLEEP APNEA  Have you ever been diagnosed with sleep apnea through a sleep study? Yes  If yes, do you have and use a CPAP or BPAP machine every night? 0  Do you snore loudly (loud enough to be heard through closed doors)?  1  Do you often feel tired, fatigued, or sleepy during the daytime (such as falling asleep during driving or talking to someone)? 0  Has anyone observed you stop breathing during your sleep? 0  Do you have, or are you being treated for high blood pressure? 1  BMI more than 35 kg/m2? 1  Age > 50 (1-yes) 1  Neck circumference greater than:Male 16 inches or larger, Male 17inches or larger? 1  Male Gender (Yes=1) 1  Obstructive Sleep Apnea Score 6  Score 5 or greater  Results sent to PCP

## 2021-01-21 NOTE — Patient Instructions (Signed)
Medication Instructions:  No changes  *If you need a refill on your cardiac medications before your next appointment, please call your pharmacy*   Lab Work: Not needed   Testing/Procedures: Not needed   Follow-Up: At [facility], you and your health needs are our priority.  As part of our continuing mission to provide you with exceptional heart care, we have created designated Provider Care Teams.  These Care Teams include your primary Cardiologist (physician) and Advanced Practice Providers (APPs -  Physician Assistants and Nurse Practitioners) who all work together to provide you with the care you need, when you need it.  We recommend signing up for the patient portal called "MyChart".  Sign up information is provided on this After Visit Summary.  MyChart is used to connect with patients for Virtual Visits (Telemedicine).  Patients are able to view lab/test results, encounter notes, upcoming appointments, etc.  Non-urgent messages can be sent to your provider as well.   To learn more about what you can do with MyChart, go to [email or web address].    Your next appointment:   12 month(s)  The format for your next appointment:   In Person  Provider:   Glenetta Hew, MD   Other Instructions Clearance for lipoma from a cardiac standpoint

## 2021-01-21 NOTE — Progress Notes (Signed)
Primary Care Provider: Gregor Hams, FNP Cardiologist: Glenetta Hew, MD Electrophysiologist: None  Clinic Note: Chief Complaint  Patient presents with   Pre-op Exam    Preop for right lateral abdominal wall lipoma resection   Follow-up    Seen 1 year ago for chest pain evaluation.  No further chest pain.  Did not have cardiac CTA done because of anxiety and tachycardia    ===================================  ASSESSMENT/PLAN   Problem List Items Addressed This Visit     Preop cardiovascular exam    Mr. Mathew Ramsey is scheduled to have local excision of the lipoma which is a low risk surgery.  He apparently is can be put under general anesthesia which we struggled to understand the reasoning behind.  He does not have any active cardiac symptoms of either angina or heart failure.  He has underlying tachycardia which is stable.  I would like to have him have him on a beta-blocker, but he got hypotensive with a combination of benazepril and HCTZ plus beta-blocker.  He would not have enough time between now and then to actually get established on a beta-blocker, therefore no indication to start preop.  Planned surgery is low risk, no active angina or heart failure symptoms.  No prior stroke, normal renal function, diabetic, but not on insulin (A1c 7.8).  He is easily able achieve at least 8-10 METS without chest pain or dyspnea..  Based on the Revised Cardiovascular Risk Index he would be considered a low risk patient for low risk surgery with no active symptoms.  With no active symptoms, would not pursue any invasive or noninvasive cardiac evaluation at this point as it would not change management.  Recommendation: Proceed to the OR for planned surgery without further evaluation.  We do not have enough time to consider adding beta-blocker for additional benefit. Perhaps his only major risk would be related to obesity and sleep apnea.       Sleep apnea (Chronic)    Was supposed  to have seen Dr. Claiborne Billings for sleep medicine evaluation.  Unfortunately this was canceled.  Need to look toward rescheduling, but for now would not change the course of his upcoming surgery.  Simply need to monitor for difficulty with extubation due to likely CPAP.       Hypertension associated with diabetes ([place]) - Primary (Chronic)    Blood pressures controlled on benazepril and HCTZ.  Had been on carvedilol, but stopped it because of dizziness and lightheadedness.  I do think that it would be beneficial for him to be on something for rate control, but complaint of postop.       Relevant Medications   JARDIANCE 10 MG TABS tablet   pioglitazone (ACTOS) 30 MG tablet   Hyperlipidemia associated with type 2 diabetes mellitus ([place]) (Chronic)    Labs from March showed total cholesterol 143 and LDL 72 on atorvastatin 40 mg.  Tolerating well.  Hopefully, with continued exercise and change in diet, this will continue to improve.  He is already showing some improvement.  A1c is improving to 7.0 from 7.8 on combination of empagliflozin, pioglitazone, metformin and semaglutide.  On stable regimen.  Hopefully with continued weight loss, this will improve as well.       Relevant Medications   JARDIANCE 10 MG TABS tablet   pioglitazone (ACTOS) 30 MG tablet   Chest pain radiating to arm (Chronic)    Has not had any further chest pain since after I saw him last time.  At this point, with no active symptoms, no need to pursue ischemic evaluation.  His episodes before probably related to musculoskeletal symptoms.  He clearly would not tolerate coronary CTA, therefore the only option would have if he truly be Myoview.  I am leery of false positives due to his body habitus.  Therefore, in the absence of symptoms, would not pursue Myoview.        ===================================  HPI:    Mathew Ramsey is a Morbidly Obese 55 y.o. male with a PMH notable for HTN, HLD, DM-2 (on insulin), OSA on  CPAP below who presents today for annual follow-up/preop evaluation for lipoma surgery next month.Maryanna Shape was last seen on January 02, 2020 for evaluation of chest pain and exertional dyspnea.  He was concerned because of his underlying cardiac risk factors and therefore we discussed doing a coronary CTA.  The symptoms that he had were sharp stabbing pains in the left upper chest radiated to the left arm with/without exertion.  He also noted occasional epigastric discomfort that went into his chest with fullness often associated with different foods. Doing well with CPAP. Unfortunately, he did not go through the CT scan because of anxiety and inability get his heart rate down.  Recent Hospitalizations: None  Reviewed  CV studies:    The following studies were reviewed today: (if available, images/films reviewed: From Epic Chart or Care Everywhere) Cardiac CTA attempted 02/23/2020-attempt aborted.  Very anxious, heart rate increased to 98 bpm.  Unable to reduce heart rate.   Interval History:   Mathew Ramsey returns here today for essentially annual follow-up no longer having any of the chest discomfort.  He has been much more active since I last saw on and is lost a significant mount of weight.  Is been walking routinely.  He denies any chest pain or pressure with rest or exertion.  He only gets short of breath if he overdoes it or is walking fast uphill.  He denies any palpitations or irregular heartbeats.  He got really lightheaded with a combination of 2 blood pressure medications.  Is now only on the benazepril and HCTZ.  Not on anything for rate control.  He did not tolerate Januvia, is now on Jardiance and Ozempic along with pioglitazone and metformin.  CV Review of Symptoms (Summary) Cardiovascular ROS: positive for - only some exertional dyspnea related to deconditioning. negative for - chest pain, edema, irregular heartbeat, orthopnea, palpitations, paroxysmal nocturnal  dyspnea, rapid heart rate, shortness of breath, or syncope/near syncope or TIA/amaurosis fugax, claudication  REVIEWED OF SYSTEMS   Review of Systems  Constitutional:  Positive for weight loss. Negative for malaise/fatigue.  HENT:  Negative for congestion and nosebleeds.   Respiratory:  Positive for shortness of breath (Exertional dyspnea notably improved with weight loss).   Gastrointestinal:  Negative for blood in stool and melena.  Genitourinary:  Negative for hematuria.  Musculoskeletal:  Positive for back pain and joint pain (Mild arthritis pains.).  Neurological:  Negative for dizziness, focal weakness and weakness.  Psychiatric/Behavioral: Negative.  Negative for memory loss. The patient is not nervous/anxious and does not have insomnia.    I have reviewed and (if needed) personally updated the patient's problem list, medications, allergies, past medical and surgical history, social and family history.   PAST MEDICAL HISTORY   Past Medical History:  Diagnosis Date   Allergy    Diabetes mellitus type II, uncontrolled ([place])    Dyslipidemia    GERD (  gastroesophageal reflux disease)    Hyperlipidemia    Hypertension    no CPAP    Obesity     PAST SURGICAL HISTORY   Past Surgical History:  Procedure Laterality Date   TRANSTHORACIC ECHOCARDIOGRAM  06/05/2017   [facility]: Mild concentric LVH.  EF 60 to 65%.  No R WMA.  GR 1 DD.  Normal RV size with mildly reduced function.  Unable assess RV pressures.  Grossly normal aortic valve.   TRIGGER FINGER RELEASE Left     There is no immunization history on file for this patient.  MEDICATIONS/ALLERGIES   Current Meds  Medication Sig   atorvastatin (LIPITOR) 40 MG tablet Take by mouth.   benazepril-hydrochlorthiazide (LOTENSIN HCT) 20-12.5 MG tablet Take by mouth.   Blood Glucose Monitoring Suppl (ONE TOUCH ULTRA 2) w/Device KIT Use to check blood sugar as directed   [EXPIRED] cephALEXin (KEFLEX) 500  MG capsule Take 1 capsule (500 mg total) by mouth 4 (four) times daily for 3 days.   fexofenadine-pseudoephedrine (ALLEGRA-D 24 HOUR) 180-240 MG per 24 hr tablet Take 1 tablet by mouth daily.   Insulin Pen Needle (PEN NEEDLES) 31G X 6 MM MISC by Does not apply route.   JARDIANCE 10 MG TABS tablet Take 10 mg by mouth daily.   lansoprazole (PREVACID) 15 MG capsule Take by mouth.   metFORMIN (GLUCOPHAGE-XR) 500 MG 24 hr tablet Take 500 mg by mouth 2 (two) times daily.   ondansetron (ZOFRAN) 4 MG tablet Take 1 tablet (4 mg total) by mouth every 8 (eight) hours as needed for nausea or vomiting.   ONETOUCH ULTRA test strip 1 each 2 (two) times daily.   OZEMPIC, 1 MG/DOSE, 4 MG/3ML SOPN    pioglitazone (ACTOS) 30 MG tablet Take 30 mg by mouth daily.   sildenafil (REVATIO) 20 MG tablet Take 50MG (2 and a half tablets) to 100MG (5 tablets) 30 minutes prior to sex, no more than once per day.   sildenafil (VIAGRA) 100 MG tablet Take 1 tablet (100 mg total) by mouth daily as needed for erectile dysfunction.   traMADol (ULTRAM) 50 MG tablet Take 1 tablet (50 mg total) by mouth every 8 (eight) hours as needed for up to 5 days for severe pain.   [DISCONTINUED] pioglitazone-metformin (ACTOPLUS MET) 15-850 MG per tablet Take 1 tablet by mouth 2 (two) times daily with a meal.    Allergies  Allergen Reactions   Sulfa Antibiotics Other (See Comments)    [name] Syndrome.    SOCIAL HISTORY/FAMILY HISTORY   Reviewed in Epic:  Pertinent findings:  Social History   Tobacco Use   Smoking status: Former    Pack years: 0.00    Types: Cigarettes    Quit date: 04/12/2010    Years since quitting: 10.7   Smokeless tobacco: Never  Substance Use Topics   Alcohol use: Yes    Alcohol/week: 2.0 standard drinks    Types: 2 drink(s) per week    Comment: beer on weekend   Drug use: No   Social History   Social History Narrative   He was recently elected as the [employer] [place], EKG, labs   Was 365 in 12/2019 Wt Readings from Last 3 Encounters:  01/21/21 (!) 339 lb (153.8 kg)  01/18/21 (!) 346 lb (156.9 kg)  11/30/20 (!) 345 lb (156.5 kg)    Physical Exam: BP 128/78 (BP Location: Left Arm, Patient Position: Sitting, Cuff Size:  Normal)   Pulse (!) 115   Resp 18   Ht 5' 11"  (1.803 m)   Wt (!) 339 lb (153.8 kg)   SpO2 95%   BMI 47.28 kg/m  Physical Exam Vitals reviewed.  Constitutional:      General: He is not in acute distress.    Appearance: He is obese. He is not ill-appearing or toxic-appearing.     Comments: Morbidly obese, but notable weight loss.  Well-nourished, well-groomed  HENT:     Head: Normocephalic and atraumatic.  Neck:     Vascular: No carotid bruit, hepatojugular reflux or JVD.  Cardiovascular:     Rate and Rhythm: Regular rhythm. Tachycardia present. No extrasystoles are present.    Chest Wall: PMI is not displaced.     Heart sounds: S2 normal. Heart sounds are distant. No murmur heard.   No friction rub. No gallop. No S4 sounds.  Pulmonary:     Effort: Pulmonary effort is normal. No respiratory distress.     Breath sounds: Normal breath sounds. No rhonchi or rales.  Chest:     Chest wall: No tenderness.  Musculoskeletal:        General: No swelling. Normal range of motion.     Cervical back: Normal range of motion.  Skin:    General: Skin is warm and dry.  Neurological:     General: No focal deficit present.     Mental Status: He is alert and oriented to person, place, and time. Mental status is at baseline.  Psychiatric:        Mood and Affect: Mood normal.        Behavior: Behavior normal.        Thought Content: Thought content normal.        Judgment: Judgment normal.     Adult ECG Report Not checked  Recent Labs:    [place] Related to Lipid Panel Component 01/03/21 09/29/20  06/28/20   Cholesterol, Total  146 160  Triglycerides  88 82  HDL  57 58  VLDL Cholesterol Cal  17 15  LDL  72  87  Hgb A1c 7.0 7.8 7.7   Comprehensive Metabolic Panel Component 27/74/12 06/28/20 12/10/19  Glucose 120 High  184 High  126 High   BUN 11 13 16   Creatinine 1.12 1.15 1.05  eGFR 79  -- --  BUN/Creatinine Ratio 10 11 15   Sodium 140 136 140  Potassium 3.9 4.2 4.5  Chloride 104 97 104  CO2 21 22 22   CALCIUM 8.9 9.2 9.5  Total Protein 6.2 7.7 7.0  Albumin, Serum 3.8 4.5 4.5  Globulin, Total 2.4 3.2 2.5  Albumin/Globulin Ratio 1.6 1.4 1.8  Total Bilirubin 0.8 1.1 0.5  Alkaline Phosphatase 83 118  88  AST 14 16 11   ALT (SGPT) 16 17 13     Lab Results  Component Value Date   CREATININE 1.05 02/20/2020   BUN 13 02/20/2020   NA 136 02/20/2020   K 4.4 02/20/2020   CL 98 02/20/2020   CO2 23 02/20/2020   CBC Latest Ref Rng & Units 08/22/2016 10/15/2014 02/06/2014  WBC 4.0 - 10.5 K/uL 4.6 3.5(L) 5.1  Hemoglobin 13.0 - 17.0 g/dL 15.1 16.6 15.4  Hematocrit 39.0 - 52.0 % 43.6 49.4 44.7  Platelets 150 - 400 K/uL 132(L) 143(L) 132(L)    No results found for: TSH  ==================================================  COVID-19 Education: The signs and symptoms of COVID-19 were discussed with the patient and how to seek care for  testing (follow up with PCP or arrange E-visit).    I spent a total of 25 minutes with the patient spent in direct patient consultation.  Additional time spent with chart review  / charting (studies, outside notes, etc): 21  min Total Time: 46 min  Current medicines are reviewed at length with the patient today.  (+/- concerns) n/a  This visit occurred during the SARS-CoV-2 public health emergency.  Safety protocols were in place, including screening questions prior to the visit, additional usage of staff PPE, and extensive cleaning of exam room while observing appropriate contact time as indicated for disinfecting solutions.  Notice: This dictation was prepared with Dragon dictation along with smaller phrase technology. Any transcriptional errors that result  from this process are unintentional and may not be corrected upon review.  Patient Instructions / Medication Changes & Studies & Tests Ordered   Patient Instructions  Medication Instructions:  No changes  *If you need a refill on your cardiac medications before your next appointment, please call your pharmacy*   Lab Work: Not needed   Testing/Procedures: Not needed   Follow-Up: At [facility], you and your health needs are our priority.  As part of our continuing mission to provide you with exceptional heart care, we have created designated Provider Care Teams.  These Care Teams include your primary Cardiologist (physician) and Advanced Practice Providers (APPs -  Physician Assistants and Nurse Practitioners) who all work together to provide you with the care you need, when you need it.  We recommend signing up for the patient portal called "MyChart".  Sign up information is provided on this After Visit Summary.  MyChart is used to connect with patients for Virtual Visits (Telemedicine).  Patients are able to view lab/test results, encounter notes, upcoming appointments, etc.  Non-urgent messages can be sent to your provider as well.   To learn more about what you can do with MyChart, go to [email or web address].    Your next appointment:   12 month(s)  The format for your next appointment:   In Person  Provider:   Glenetta Hew, MD   Other Instructions Clearance for lipoma from a cardiac standpoint    Studies Ordered:   No orders of the defined types were placed in this encounter.    Glenetta Hew, M.D., M.S. Interventional Cardiologist   Pager # [phone] Phone # [phone] [address]. [place], [place] [address]   Thank you for choosing Heartcare at [facility]!!

## 2021-01-23 ENCOUNTER — Encounter: Payer: Self-pay | Admitting: Cardiology

## 2021-01-23 NOTE — Assessment & Plan Note (Addendum)
Mathew Ramsey is scheduled to have local excision of the lipoma which is a low risk surgery.  He apparently is can be put under general anesthesia which we struggled to understand the reasoning behind.  He does not have any active cardiac symptoms of either angina or heart failure.  He has underlying tachycardia which is stable.  I would like to have him have him on a beta-blocker, but he got hypotensive with a combination of benazepril and HCTZ plus beta-blocker.  He would not have enough time between now and then to actually get established on a beta-blocker, therefore no indication to start preop.  Planned surgery is low risk, no active angina or heart failure symptoms.  No prior stroke, normal renal function, diabetic, but not on insulin (A1c 7.8).  He is easily able achieve at least 8-10 METS without chest pain or dyspnea..  Based on the Revised Cardiovascular Risk Index he would be considered a low risk patient for low risk surgery with no active symptoms.  With no active symptoms, would not pursue any invasive or noninvasive cardiac evaluation at this point as it would not change management.  Recommendation: Proceed to the OR for planned surgery without further evaluation.  We do not have enough time to consider adding beta-blocker for additional benefit.  Perhaps his only major risk would be related to obesity and sleep apnea.

## 2021-01-23 NOTE — Assessment & Plan Note (Addendum)
Labs from March showed total cholesterol 143 and LDL 72 on atorvastatin 40 mg.  Tolerating well.  Hopefully, with continued exercise and change in diet, this will continue to improve.  He is already showing some improvement.  A1c is improving to 7.0 from 7.8 on combination of empagliflozin, pioglitazone, metformin and semaglutide.  On stable regimen.  Hopefully with continued weight loss, this will improve as well.

## 2021-01-23 NOTE — Assessment & Plan Note (Signed)
Blood pressures controlled on benazepril and HCTZ.  Had been on carvedilol, but stopped it because of dizziness and lightheadedness.  I do think that it would be beneficial for him to be on something for rate control, but complaint of postop.

## 2021-01-23 NOTE — Assessment & Plan Note (Signed)
Was supposed to have seen Dr. Claiborne Billings for sleep medicine evaluation.  Unfortunately this was canceled.  Need to look toward rescheduling, but for now would not change the course of his upcoming surgery.  Simply need to monitor for difficulty with extubation due to likely CPAP.

## 2021-01-23 NOTE — Assessment & Plan Note (Signed)
Has not had any further chest pain since after I saw him last time.  At this point, with no active symptoms, no need to pursue ischemic evaluation.  His episodes before probably related to musculoskeletal symptoms.  He clearly would not tolerate coronary CTA, therefore the only option would have if he truly be Myoview.  I am leery of false positives due to his body habitus.  Therefore, in the absence of symptoms, would not pursue Myoview.

## 2021-01-25 ENCOUNTER — Encounter (HOSPITAL_BASED_OUTPATIENT_CLINIC_OR_DEPARTMENT_OTHER)
Admission: RE | Admit: 2021-01-25 | Discharge: 2021-01-25 | Disposition: A | Discharge status: Home / Self Care | Payer: BC Managed Care – PPO | Source: Ambulatory Visit | Attending: Plastic Surgery | Admitting: Plastic Surgery

## 2021-01-25 DIAGNOSIS — Z01812 Encounter for preprocedural laboratory examination: Secondary | ICD-10-CM | POA: Insufficient documentation

## 2021-01-25 LAB — BASIC METABOLIC PANEL
Anion gap: 11 (ref 5–15)
BUN: 14 mg/dL (ref 6–20)
CO2: 25 mmol/L (ref 22–32)
Calcium: 9.7 mg/dL (ref 8.9–10.3)
Chloride: 99 mmol/L (ref 98–111)
Creatinine, Ser: 1.23 mg/dL (ref 0.61–1.24)
GFR, Estimated: >60 mL/min (ref >60)
Glucose, Bld: 129 mg/dL — ABNORMAL HIGH (ref 70–99)
Potassium: 4.6 mmol/L (ref 3.5–5.1)
Sodium: 135 mmol/L (ref 135–145)

## 2021-01-27 ENCOUNTER — Encounter (HOSPITAL_BASED_OUTPATIENT_CLINIC_OR_DEPARTMENT_OTHER)
Admission: RE | Admit: 2021-01-27 | Discharge: 2021-01-27 | Disposition: A | Discharge status: Home / Self Care | Payer: BC Managed Care – PPO | Source: Ambulatory Visit | Attending: Plastic Surgery | Admitting: Plastic Surgery

## 2021-01-27 DIAGNOSIS — Z01810 Encounter for preprocedural cardiovascular examination: Secondary | ICD-10-CM | POA: Diagnosis present

## 2021-01-27 DIAGNOSIS — I10 Essential (primary) hypertension: Secondary | ICD-10-CM | POA: Diagnosis not present

## 2021-01-27 DIAGNOSIS — E119 Type 2 diabetes mellitus without complications: Secondary | ICD-10-CM | POA: Diagnosis not present

## 2021-01-27 NOTE — Progress Notes (Signed)
Chart reviewed by Dr. Sabra Heck, ok to proceed as planned with surgery at [facility].

## 2021-02-03 ENCOUNTER — Encounter (HOSPITAL_BASED_OUTPATIENT_CLINIC_OR_DEPARTMENT_OTHER)
Admission: RE | Disposition: A | Discharge status: Home / Self Care | Payer: Self-pay | Source: Home / Self Care | Attending: Plastic Surgery

## 2021-02-03 ENCOUNTER — Ambulatory Visit (HOSPITAL_BASED_OUTPATIENT_CLINIC_OR_DEPARTMENT_OTHER)
Admission: RE | Admit: 2021-02-03 | Discharge: 2021-02-03 | Disposition: A | Discharge status: Home / Self Care | Payer: BC Managed Care – PPO | Attending: Plastic Surgery | Admitting: Plastic Surgery

## 2021-02-03 ENCOUNTER — Encounter (HOSPITAL_BASED_OUTPATIENT_CLINIC_OR_DEPARTMENT_OTHER): Payer: Self-pay | Admitting: Plastic Surgery

## 2021-02-03 ENCOUNTER — Ambulatory Visit (HOSPITAL_BASED_OUTPATIENT_CLINIC_OR_DEPARTMENT_OTHER): Payer: BC Managed Care – PPO | Admitting: Anesthesiology

## 2021-02-03 ENCOUNTER — Other Ambulatory Visit: Payer: Self-pay

## 2021-02-03 DIAGNOSIS — Z87891 Personal history of nicotine dependence: Secondary | ICD-10-CM | POA: Diagnosis not present

## 2021-02-03 DIAGNOSIS — D171 Benign lipomatous neoplasm of skin and subcutaneous tissue of trunk: Secondary | ICD-10-CM | POA: Insufficient documentation

## 2021-02-03 DIAGNOSIS — Z882 Allergy status to sulfonamides status: Secondary | ICD-10-CM | POA: Insufficient documentation

## 2021-02-03 HISTORY — DX: Gastro-esophageal reflux disease without esophagitis: K21.9

## 2021-02-03 HISTORY — PX: LIPOMA EXCISION: SHX5283

## 2021-02-03 LAB — GLUCOSE, CAPILLARY
Glucose-Capillary: 141 mg/dL — ABNORMAL HIGH (ref 70–99)
Glucose-Capillary: 124 mg/dL — ABNORMAL HIGH (ref 70–99)

## 2021-02-03 SURGERY — EXCISION LIPOMA
Anesthesia: General | Site: Abdomen | Laterality: Right

## 2021-02-03 MED ORDER — EPINEPHRINE PF 1 MG/ML IJ SOLN
INTRAMUSCULAR | Status: AC
  Filled 2021-02-03: qty 1

## 2021-02-03 MED ORDER — LIDOCAINE HCL (CARDIAC) PF 100 MG/5ML IV SOSY
PREFILLED_SYRINGE | INTRAVENOUS | Status: DC | PRN
  Administered 2021-02-03: 100 mg via INTRAVENOUS

## 2021-02-03 MED ORDER — PHENYLEPHRINE 40 MCG/ML (10ML) SYRINGE FOR IV PUSH (FOR BLOOD PRESSURE SUPPORT)
PREFILLED_SYRINGE | INTRAVENOUS | Status: AC
  Filled 2021-02-03: qty 10

## 2021-02-03 MED ORDER — MIDAZOLAM HCL 2 MG/2ML IJ SOLN
INTRAMUSCULAR | Status: AC
  Filled 2021-02-03: qty 2

## 2021-02-03 MED ORDER — MIDAZOLAM HCL 5 MG/5ML IJ SOLN
INTRAMUSCULAR | Status: DC | PRN
  Administered 2021-02-03: 2 mg via INTRAVENOUS

## 2021-02-03 MED ORDER — CEFAZOLIN IN SODIUM CHLORIDE 3-0.9 GM/100ML-% IV SOLN
INTRAVENOUS | Status: AC
  Filled 2021-02-03: qty 100

## 2021-02-03 MED ORDER — PROPOFOL 10 MG/ML IV BOLUS: INTRAVENOUS | Status: DC | PRN

## 2021-02-03 MED ORDER — OXYCODONE HCL 5 MG PO TABS: 5.0000 mg | ORAL_TABLET | ORAL | Status: DC | PRN

## 2021-02-03 MED ORDER — SODIUM CHLORIDE 0.9% FLUSH: 3.0000 mL | INTRAVENOUS | Status: DC | PRN

## 2021-02-03 MED ORDER — LIDOCAINE-EPINEPHRINE 1 %-1:100000 IJ SOLN
INTRAMUSCULAR | Status: DC | PRN
  Administered 2021-02-03: 7 mL

## 2021-02-03 MED ORDER — LIDOCAINE HCL (PF) 2 % IJ SOLN
INTRAMUSCULAR | Status: AC
  Filled 2021-02-03: qty 5

## 2021-02-03 MED ORDER — DROPERIDOL 2.5 MG/ML IJ SOLN
INTRAMUSCULAR | Status: DC | PRN
  Administered 2021-02-03: .625 mg via INTRAVENOUS

## 2021-02-03 MED ORDER — SUCCINYLCHOLINE CHLORIDE 200 MG/10ML IV SOSY
PREFILLED_SYRINGE | INTRAVENOUS | Status: AC
  Filled 2021-02-03: qty 10

## 2021-02-03 MED ORDER — ONDANSETRON HCL 4 MG/2ML IJ SOLN
INTRAMUSCULAR | Status: AC
  Filled 2021-02-03: qty 2

## 2021-02-03 MED ORDER — FENTANYL CITRATE (PF) 100 MCG/2ML IJ SOLN: 25.0000 ug | INTRAMUSCULAR | Status: DC | PRN

## 2021-02-03 MED ORDER — ACETAMINOPHEN 325 MG PO TABS: 650.0000 mg | ORAL_TABLET | ORAL | Status: DC | PRN

## 2021-02-03 MED ORDER — OXYCODONE HCL 5 MG PO TABS: 5.0000 mg | ORAL_TABLET | Freq: Once | ORAL | Status: DC | PRN

## 2021-02-03 MED ORDER — FENTANYL CITRATE (PF) 100 MCG/2ML IJ SOLN
INTRAMUSCULAR | Status: AC
  Filled 2021-02-03: qty 2

## 2021-02-03 MED ORDER — LIDOCAINE 2% (20 MG/ML) 5 ML SYRINGE: INTRAMUSCULAR | Status: DC | PRN

## 2021-02-03 MED ORDER — ACETAMINOPHEN 325 MG RE SUPP: 650.0000 mg | RECTAL | Status: DC | PRN

## 2021-02-03 MED ORDER — LIDOCAINE HCL 1 % IJ SOLN
INTRAVENOUS | Status: DC | PRN
  Administered 2021-02-03: 1000 mL

## 2021-02-03 MED ORDER — SODIUM CHLORIDE 0.9 % IV SOLN: 250.0000 mL | INTRAVENOUS | Status: DC | PRN

## 2021-02-03 MED ORDER — CHLORHEXIDINE GLUCONATE CLOTH 2 % EX PADS: 6.0000 | MEDICATED_PAD | Freq: Once | CUTANEOUS | Status: DC

## 2021-02-03 MED ORDER — OXYCODONE HCL 5 MG/5ML PO SOLN: 5.0000 mg | Freq: Once | ORAL | Status: DC | PRN

## 2021-02-03 MED ORDER — PROPOFOL 500 MG/50ML IV EMUL
INTRAVENOUS | Status: AC
  Filled 2021-02-03: qty 50

## 2021-02-03 MED ORDER — PROPOFOL 10 MG/ML IV BOLUS
INTRAVENOUS | Status: DC | PRN
  Administered 2021-02-03: 300 mg via INTRAVENOUS

## 2021-02-03 MED ORDER — LIDOCAINE HCL (PF) 1 % IJ SOLN
INTRAMUSCULAR | Status: AC
  Filled 2021-02-03: qty 30

## 2021-02-03 MED ORDER — SODIUM CHLORIDE 0.9% FLUSH: 3.0000 mL | Freq: Two times a day (BID) | INTRAVENOUS | Status: DC

## 2021-02-03 MED ORDER — BUPIVACAINE HCL (PF) 0.25 % IJ SOLN
INTRAMUSCULAR | Status: AC
  Filled 2021-02-03: qty 30

## 2021-02-03 MED ORDER — FENTANYL CITRATE (PF) 100 MCG/2ML IJ SOLN
INTRAMUSCULAR | Status: DC | PRN
  Administered 2021-02-03: 100 ug via INTRAVENOUS
  Administered 2021-02-03 (×2): 50 ug via INTRAVENOUS

## 2021-02-03 MED ORDER — LACTATED RINGERS IV SOLN: INTRAVENOUS | Status: DC

## 2021-02-03 MED ORDER — ONDANSETRON HCL 4 MG/2ML IJ SOLN
4.0000 mg | Freq: Once | INTRAMUSCULAR | Status: DC | PRN
Start: 2021-02-03 — End: 2021-02-03

## 2021-02-03 MED ORDER — CEFAZOLIN IN SODIUM CHLORIDE 3-0.9 GM/100ML-% IV SOLN
3.0000 g | INTRAVENOUS | Status: AC
  Administered 2021-02-03: 3 g via INTRAVENOUS

## 2021-02-03 MED ORDER — EPHEDRINE 5 MG/ML INJ
INTRAVENOUS | Status: AC
  Filled 2021-02-03: qty 10

## 2021-02-03 SURGICAL SUPPLY — 43 items
ADH SKN CLS APL DERMABOND .7 (GAUZE/BANDAGES/DRESSINGS) ×1
BLADE CLIPPER SURG (BLADE) IMPLANT
BLADE HEX COATED 2.75 (ELECTRODE) IMPLANT
BLADE SURG 15 STRL LF DISP TIS (BLADE) ×1 IMPLANT
BLADE SURG 15 STRL SS (BLADE) ×2
CANISTER SUCT 1200ML W/VALVE (MISCELLANEOUS) ×2 IMPLANT
COVER BACK TABLE 60X90IN (DRAPES) ×2 IMPLANT
COVER MAYO STAND STRL (DRAPES) ×2 IMPLANT
DERMABOND ADVANCED (GAUZE/BANDAGES/DRESSINGS) ×1
DERMABOND ADVANCED .7 DNX12 (GAUZE/BANDAGES/DRESSINGS) ×1 IMPLANT
DRAPE LAPAROTOMY 100X72 PEDS (DRAPES) IMPLANT
DRAPE U-SHAPE 76X120 STRL (DRAPES) ×2 IMPLANT
DRSG MEPILEX BORDER 4X8 (GAUZE/BANDAGES/DRESSINGS) ×2 IMPLANT
ELECT BLADE 4.0 EZ CLEAN MEGAD (MISCELLANEOUS) ×2
ELECT COATED BLADE 2.86 ST (ELECTRODE) ×2 IMPLANT
ELECT REM PT RETURN 9FT ADLT (ELECTROSURGICAL) ×2
ELECTRODE BLDE 4.0 EZ CLN MEGD (MISCELLANEOUS) ×1 IMPLANT
ELECTRODE REM PT RTRN 9FT ADLT (ELECTROSURGICAL) ×1 IMPLANT
GAUZE SPONGE 4X4 12PLY STRL LF (GAUZE/BANDAGES/DRESSINGS) ×2 IMPLANT
GLOVE SURG ENC MOIS LTX SZ6.5 (GLOVE) ×2 IMPLANT
GOWN STRL REUS W/ TWL LRG LVL3 (GOWN DISPOSABLE) ×1 IMPLANT
GOWN STRL REUS W/TWL LRG LVL3 (GOWN DISPOSABLE) ×2
NEEDLE HYPO 25X1 1.5 SAFETY (NEEDLE) ×2 IMPLANT
NS IRRIG 1000ML POUR BTL (IV SOLUTION) ×2 IMPLANT
PACK BASIN DAY SURGERY FS (CUSTOM PROCEDURE TRAY) ×2 IMPLANT
PAD FOAM SILICONE BACKED (GAUZE/BANDAGES/DRESSINGS) ×2 IMPLANT
PENCIL SMOKE EVACUATOR (MISCELLANEOUS) IMPLANT
SPONGE GAUZE 2X2 8PLY STRL LF (GAUZE/BANDAGES/DRESSINGS) IMPLANT
STRIP CLOSURE SKIN 1/2X4 (GAUZE/BANDAGES/DRESSINGS) IMPLANT
STRIP SUTURE WOUND CLOSURE 1/2 (MISCELLANEOUS) ×2 IMPLANT
SUCTION FRAZIER HANDLE 10FR (MISCELLANEOUS)
SUCTION TUBE FRAZIER 10FR DISP (MISCELLANEOUS) IMPLANT
SUT ETHILON 4 0 PS 2 18 (SUTURE) IMPLANT
SUT MNCRL AB 4-0 PS2 18 (SUTURE) ×2 IMPLANT
SUT MON AB 3-0 SH 27 (SUTURE) ×2
SUT MON AB 3-0 SH27 (SUTURE) ×1 IMPLANT
SUT MON AB 5-0 PS2 18 (SUTURE) ×2 IMPLANT
SYR BULB EAR ULCER 3OZ GRN STR (SYRINGE) IMPLANT
SYR CONTROL 10ML LL (SYRINGE) ×4 IMPLANT
TOWEL GREEN STERILE FF (TOWEL DISPOSABLE) ×2 IMPLANT
TRAY DSU PREP LF (CUSTOM PROCEDURE TRAY) ×2 IMPLANT
TUBE CONNECTING 20X1/4 (TUBING) ×2 IMPLANT
YANKAUER SUCT BULB TIP NO VENT (SUCTIONS) ×2 IMPLANT

## 2021-02-03 NOTE — Op Note (Signed)
DATE OF OPERATION: 02/03/2021  LOCATION: [facility]  PREOPERATIVE DIAGNOSIS: abdominal lipoma 5 x 14 cm  POSTOPERATIVE DIAGNOSIS: Same  PROCEDURE: abdominal lipoma  SURGEON: Lyndee Leo Sanger Tobie Perdue, DO  ASSISTANT: Roetta Sessions, PA  EBL:  minimal  CONDITION: Stable  COMPLICATIONS: None  INDICATION: The patient, Mathew Ramsey, is a 56 y.o. male born on [birth year], is here for treatment of an abdominal lipoma.   PROCEDURE DETAILS:  The patient was seen prior to surgery and marked.  The IV antibiotics were given. The patient was taken to the operating room and given a general anesthetic. A standard time out was performed and all information was confirmed by those in the room. SCDs were placed.   The skin was prepped and draped.  Local with epinephrine was placed about the area.  Tumescent was placed.  The #10 blade was used to make a small incision over the lesion.  The bovie and tissue scissors were used to dissect to the mass which appeared to be a lipoma.  The lipoma was 6 x 14 cm in size.  In order to keep the incision small it was removed in pieces.  This was sent to path along with the liposuction to even out the abdominal area and prevent a concave deficit in the abdominal wall.  The 600 cc was sent to path.  The incision was closed with the 3-0 and 4-0 Monocryl.  Derma bond and steri strips were placed with the topifoam.  The patient was allowed to wake up and taken to recovery room in stable condition at the end of the case. The family was notified at the end of the case.   The advanced practice practitioner (APP) assisted throughout the case.  The APP was essential in retraction and counter traction when needed to make the case progress smoothly.  This retraction and assistance made it possible to see the tissue plans for the procedure.  The assistance was needed for blood control, tissue re-approximation and assisted with closure of the incision site.

## 2021-02-03 NOTE — Anesthesia Procedure Notes (Signed)
Procedure Name: LMA Insertion Date/Time: 02/03/2021 10:45 AM Performed by: Willa Frater, CRNA Pre-anesthesia Checklist: Patient identified, Emergency Drugs available, Suction available and Patient being monitored Patient Re-evaluated:Patient Re-evaluated prior to induction Oxygen Delivery Method: Circle system utilized Preoxygenation: Pre-oxygenation with 100% oxygen Induction Type: IV induction Ventilation: Mask ventilation without difficulty LMA: LMA inserted LMA Size: 5.0 Number of attempts: 1 Airway Equipment and Method: Bite block Placement Confirmation: positive ETCO2 Tube secured with: Tape Dental Injury: Teeth and Oropharynx as per pre-operative assessment

## 2021-02-03 NOTE — Interval H&P Note (Signed)
History and Physical Interval Note:  02/03/2021 9:49 AM  Mathew Ramsey  has presented today for surgery, with the diagnosis of lipoma of torso.  The various methods of treatment have been discussed with the patient and family. After consideration of risks, benefits and other options for treatment, the patient has consented to  Procedure(s) with comments: Excision of abdominal lipoma (N/A) - 60 min as a surgical intervention.  The patient's history has been reviewed, patient examined, no change in status, stable for surgery.  I have reviewed the patient's chart and labs.  Questions were answered to the patient's satisfaction.     Loel Lofty Lizzie Cokley

## 2021-02-03 NOTE — Discharge Instructions (Addendum)
INSTRUCTIONS FOR AFTER SURGERY   You will likely have some questions about what to expect following your operation.  The following information will help you and your family understand what to expect when you are discharged from the hospital.  Following these guidelines will help ensure a smooth recovery and reduce risks of complications.  Postoperative instructions include information on: diet, wound care, medications and physical activity.  AFTER SURGERY Expect to go home after the procedure.  In some cases, you may need to spend one night in the hospital for observation.  DIET This surgery does not require a specific diet.  However, I have to mention that the healthier you eat the better your body can start healing. It is important to increasing your protein intake.  This means limiting the foods with added sugar.  Focus on fruits and vegetables and some meat. It is very important to drink water after your surgery.  If your urine is bright yellow, then it is concentrated, and you need to drink more water.  As a general rule after surgery, you should have 8 ounces of water every hour while awake.  If you find you are persistently nauseated or unable to take in liquids let us know.  NO TOBACCO USE or EXPOSURE.  This will slow your healing process and increase the risk of a wound.  WOUND CARE If you don't have a drain: You can shower the day after surgery.  Use fragrance free soap.  Dial, [place], [place] and Cetaphil are usually mild on the skin.  If you have steri-strips / tape directly attached to your skin leave them in place. It is OK to get these wet.  No baths, pools or hot tubs for two weeks. We close your incision to leave the smallest and best-looking scar. No ointment or creams on your incisions until given the go ahead.  Especially not Neosporin (Too many skin reactions with this one).  A few weeks after surgery you can use Mederma and start massaging the scar.  ACTIVITY No heavy lifting until  cleared by the doctor.  It is OK to walk and climb stairs. In fact, moving your legs is very important to decrease your risk of a blood clot.  It will also help keep you from getting deconditioned.  Every 1 to 2 hours get up and walk for 5 minutes. This will help with a quicker recovery back to normal.  Let pain be your guide so you don't do too much.    WORK Everyone returns to work at different times. As a rough guide, most people take at least 1 - 2 weeks off prior to returning to work. If you need documentation for your job, bring the forms to your postoperative follow up visit.  DRIVING Arrange for someone to bring you home from the hospital.  You may be able to drive a few days after surgery but not while taking any narcotics or valium.  BOWEL MOVEMENTS Constipation can occur after anesthesia and while taking pain medication.  It is important to stay ahead for your comfort.  We recommend taking Milk of Magnesia (2 tablespoons; twice a day) while taking the pain pills.  SEROMA This is fluid your body tried to put in the surgical site.  This is normal but if it creates excessive pain and swelling let us know.  It usually decreases in a few weeks.  MEDICATIONS and PAIN CONTROL At your preoperative visit for you history and physical you were given the  following medications: An antibiotic: Start this medication when you get home and take according to the instructions on the bottle. Zofran 4 mg:  This is to treat nausea and vomiting.  You can take this every 6 hours as needed and only if needed. Norco (hydrocodone/acetaminophen) 5/325 mg:  This is only to be used after you have taken the motrin or the tylenol. Every 8 hours as needed. Over the counter Medication to take: Ibuprofen (Motrin) 600 mg:  Take this every 6 hours.  If you have additional pain then take 500 mg of the tylenol.  Only take the Norco after you have tried these two. Miralax or stool softener of choice: Take this according to  the bottle if you take the [place] Call your surgeon's office if any of the following occur:  Fever 101 degrees F or greater  Excessive bleeding or fluid from the incision site.  Pain that increases over time without aid from the medications  Redness, warmth, or pus draining from incision sites  Persistent nausea or inability to take in liquids  Severe misshapen area that underwent the operation.    Post Anesthesia Home Care Instructions  Activity: Get plenty of rest for the remainder of the day. A responsible individual must stay with you for 24 hours following the procedure.  For the next 24 hours, DO NOT: -Drive a car -Paediatric nurse -Drink alcoholic beverages -Take any medication unless instructed by your physician -Make any legal decisions or sign important papers.  Meals: Start with liquid foods such as gelatin or soup. Progress to regular foods as tolerated. Avoid greasy, spicy, heavy foods. If nausea and/or vomiting occur, drink only clear liquids until the nausea and/or vomiting subsides. Call your physician if vomiting continues.  Special Instructions/Symptoms: Your throat may feel dry or sore from the anesthesia or the breathing tube placed in your throat during surgery. If this causes discomfort, gargle with warm salt water. The discomfort should disappear within 24 hours.  If you had a scopolamine patch placed behind your ear for the management of post- operative nausea and/or vomiting:  1. The medication in the patch is effective for 72 hours, after which it should be removed.  Wrap patch in a tissue and discard in the trash. Wash hands thoroughly with soap and water. 2. You may remove the patch earlier than 72 hours if you experience unpleasant side effects which may include dry mouth, dizziness or visual disturbances. 3. Avoid touching the patch. Wash your hands with soap and water after contact with the patch.

## 2021-02-03 NOTE — Anesthesia Preprocedure Evaluation (Addendum)
Anesthesia Evaluation  Patient identified by MRN, date of birth, ID band Patient awake    Reviewed: Allergy & Precautions, NPO status , Patient's Chart, lab work & pertinent test results, reviewed documented beta blocker date and time   Airway Mallampati: II  TM Distance: >3 FB Neck ROM: Full    Dental  (+) Teeth Intact, Dental Advisory Given   Pulmonary sleep apnea , former smoker,  Intolerant to CPAP   Pulmonary exam normal breath sounds clear to auscultation       Cardiovascular hypertension, Pt. on medications + DOE  Normal cardiovascular exam Rhythm:Regular Rate:Normal     Neuro/Psych negative neurological ROS  negative psych ROS   GI/Hepatic Neg liver ROS, GERD  Medicated and Controlled,  Endo/Other  diabetes, Well Controlled, Type 2, Oral Hypoglycemic AgentsMorbid obesityHyperlipidemia  Renal/GU negative Renal ROS  negative genitourinary   Musculoskeletal Lipoma of torso   Abdominal (+) + obese,   Peds  Hematology negative hematology ROS (+)   Anesthesia Other Findings   Reproductive/Obstetrics ED                            Anesthesia Physical Anesthesia Plan  ASA: 3  Anesthesia Plan: General   Post-op Pain Management:    Induction: Intravenous  PONV Risk Score and Plan: Treatment may vary due to age or medical condition, Midazolam and Ondansetron  Airway Management Planned: LMA  Additional Equipment:   Intra-op Plan:   Post-operative Plan: Extubation in OR  Informed Consent: I have reviewed the patients History and Physical, chart, labs and discussed the procedure including the risks, benefits and alternatives for the proposed anesthesia with the patient or authorized representative who has indicated his/her understanding and acceptance.     Dental advisory given  Plan Discussed with: CRNA and Anesthesiologist  Anesthesia Plan Comments:        Anesthesia  Quick Evaluation

## 2021-02-03 NOTE — Anesthesia Postprocedure Evaluation (Signed)
Anesthesia Post Note  Patient: Mathew Ramsey  Procedure(s) Performed: Excision of abdominal lipoma (Right: Abdomen)     Patient location during evaluation: PACU Anesthesia Type: General Level of consciousness: awake and alert and oriented Pain management: pain level controlled Vital Signs Assessment: post-procedure vital signs reviewed and stable Respiratory status: spontaneous breathing, nonlabored ventilation and respiratory function stable Cardiovascular status: blood pressure returned to baseline and stable Postop Assessment: no apparent nausea or vomiting Anesthetic complications: no   No notable events documented.  Last Vitals:  Vitals:   02/03/21 1215 02/03/21 1230  BP: 133/88 (!) 146/94  Pulse: 90 88  Resp: 17 14  Temp:    SpO2: 96% 97%    Last Pain:  Vitals:   02/03/21 1230  TempSrc:   PainSc: 3                  Esteven Overfelt A.

## 2021-02-03 NOTE — Transfer of Care (Signed)
Immediate Anesthesia Transfer of Care Note  Patient: Mathew Ramsey  Procedure(s) Performed: Excision of abdominal lipoma (Right: Abdomen)  Patient Location: PACU  Anesthesia Type:General  Level of Consciousness: awake, alert , oriented, drowsy and patient cooperative  Airway & Oxygen Therapy: Patient Spontanous Breathing and Patient connected to face mask oxygen  Post-op Assessment: Report given to RN and Post -op Vital signs reviewed and stable  Post vital signs: Reviewed and stable  Last Vitals:  Vitals Value Taken Time  BP    Temp    Pulse 94 02/03/21 1149  Resp    SpO2 98 % 02/03/21 1149  Vitals shown include unvalidated device data.  Last Pain:  Vitals:   02/03/21 0923  TempSrc: Oral  PainSc: 1       Patients Stated Pain Goal: 1 (82/57/49 [id number])  Complications: No notable events documented.

## 2021-02-04 ENCOUNTER — Encounter (HOSPITAL_BASED_OUTPATIENT_CLINIC_OR_DEPARTMENT_OTHER): Payer: Self-pay | Admitting: Plastic Surgery

## 2021-02-04 LAB — SURGICAL PATHOLOGY

## 2021-02-04 NOTE — Addendum Note (Signed)
Addendum  created 02/04/21 1021 by Ezequiel Kayser, CRNA   Charge Capture section accepted

## 2021-02-15 ENCOUNTER — Other Ambulatory Visit: Payer: Self-pay

## 2021-02-15 ENCOUNTER — Ambulatory Visit (INDEPENDENT_AMBULATORY_CARE_PROVIDER_SITE_OTHER): Payer: BC Managed Care – PPO | Admitting: Plastic Surgery

## 2021-02-15 DIAGNOSIS — D171 Benign lipomatous neoplasm of skin and subcutaneous tissue of trunk: Secondary | ICD-10-CM

## 2021-02-15 NOTE — Progress Notes (Addendum)
The patient is 55-year-old gentleman here for follow-up on his abdominal lipoma.  He has a little bit of bruising and swelling still.  He denies any pain and has been doing really well.  He would like to continue and resume his physical activity.  I think at this point that is fine to do.  Start massaging any firm areas and follow-up as needed.  Pictures were obtained of the patient and placed in the chart with the patient's or guardian's permission.

## 2021-03-08 ENCOUNTER — Encounter: Payer: BC Managed Care – PPO | Admitting: Surgical

## 2024-04-07 ENCOUNTER — Telehealth: Payer: Self-pay

## 2024-04-07 ENCOUNTER — Ambulatory Visit: Payer: Self-pay | Admitting: Orthopedic Surgery

## 2024-04-07 ENCOUNTER — Other Ambulatory Visit (INDEPENDENT_AMBULATORY_CARE_PROVIDER_SITE_OTHER): Payer: Self-pay

## 2024-04-07 DIAGNOSIS — M25512 Pain in left shoulder: Secondary | ICD-10-CM

## 2024-04-07 NOTE — Telephone Encounter (Signed)
 Valium  needed for MRI ordered

## 2024-04-08 ENCOUNTER — Other Ambulatory Visit: Payer: Self-pay | Admitting: Surgical

## 2024-04-08 MED ORDER — DIAZEPAM 5 MG PO TABS: ORAL_TABLET | ORAL | 0 refills | Status: AC

## 2024-04-08 NOTE — Telephone Encounter (Signed)
Sent in rx for valium.

## 2024-04-09 ENCOUNTER — Encounter: Payer: Self-pay | Admitting: Orthopedic Surgery

## 2024-04-09 NOTE — Progress Notes (Unsigned)
 Office Visit Note   Patient: Mathew Ramsey           Date of Birth: Nov 14, 1965           MRN: [id number] Visit Date: 04/07/2024 Requested by: Lenon Ludie BIRCH, FNP No address on file PCP: Lenon Ludie BIRCH, FNP  Subjective: Chief Complaint  Patient presents with   Left Shoulder - Pain    HPI: Mathew Ramsey is a 58 y.o. male who presents to the office reporting left shoulder pain of 3 months duration.  Patient thinks he may have injured it doing push-ups.  After doing some push-ups he was lifting weights the following day and noticed some pain in the shoulder.  Initially he had some weakness but that improved.  States the shoulder is still not right.  Does have some popping when he goes overhead.  He is right-hand dominant.  Uses ice at night.  He works out about 5 days a week.  Has never really had any joint issues.  Has not taken much in the way of regular anti-inflammatories for the problem.  Hard for him to throw a Frisbee.  Localizes pain across the superior aspect of the shoulder.  Holding the phone in his hand aggravates the shoulder.  Hard for him to lay on the left-hand side as well..                ROS: All systems reviewed are negative as they relate to the chief complaint within the history of present illness.  Patient denies fevers or chills.  Assessment & Plan: Visit Diagnoses:  1. Acute pain of left shoulder     Plan: Impression is left shoulder pain which could be AC joint mediated or potentially a small rotator cuff partial-thickness tear which has improved.  Symptoms ongoing for 3 months with continued pain.  Plan at this time is MRI arthrogram left shoulder to evaluate rotator cuff tear versus AC joint arthritis.  Valium  to be prescribed before the scan.  With the patient's workout routine he has essentially tried and failed conservative management.  Follow-up after that study.  Follow-Up Instructions: No follow-ups on file.   Orders:  Orders Placed This  Encounter  Procedures   XR Shoulder Left   MR Shoulder Left w/ contrast   Arthrogram   No orders of the defined types were placed in this encounter.     Procedures: No procedures performed   Clinical Data: No additional findings.  Objective: Vital Signs: There were no vitals taken for this visit.  Physical Exam:  Constitutional: Patient appears well-developed HEENT:  Head: Normocephalic Eyes:EOM are normal Neck: Normal range of motion Cardiovascular: Normal rate Pulmonary/chest: Effort normal Neurologic: Patient is alert Skin: Skin is warm Psychiatric: Patient has normal mood and affect  Ortho Exam: Ortho exam demonstrates good cervical spine range of motion.  Patient has very good strength to infraspinatus supraspinatus and subscap muscle testing.  Does have a little bit of pain with internal and external rotation at 90 degrees of abduction on the left.  AC joint is only slightly more tender on the left compared to the right.  We can reexamine that when he follows up after his MRI scan.  Radial pulse intact bilaterally.  Passive range of motion bilaterally is approximately 50/95/160.  No coarseness or grinding detected with passive range of motion of either shoulder.  Pec tendon nontender on the left.  Specialty Comments:  No specialty comments available.  Imaging: No results found.  PMFS History: Patient Active Problem List   Diagnosis Date Noted   Preop cardiovascular exam 01/21/2021   Lipoma of torso 11/30/2020   DOE (dyspnea on exertion) 01/06/2020   Chest pain radiating to arm 01/02/2020   Hyperlipidemia associated with type 2 diabetes mellitus (HCC) 10/15/2014   Personal history of noncompliance with medical treatment, presenting hazards to health 10/15/2014   Hypertension associated with diabetes (HCC) 07/10/2013   Hypogonadism male 01/31/2012   Diabetes mellitus (HCC) 05/08/2011   Obesity    DM type 2 with diabetic dyslipidemia (HCC)    Sleep apnea     Past Medical History:  Diagnosis Date   Allergy    Diabetes mellitus type II, uncontrolled (HCC)    Dyslipidemia    GERD (gastroesophageal reflux disease)    Hyperlipidemia    Hypertension    no CPAP    Obesity     Family History  Problem Relation Age of Onset   Diabetes Mellitus II Mother    Hypertension Mother    Hyperlipidemia Mother    Heart failure Mother    Alcohol abuse Father    Diabetes Mellitus II Sister    Heart disease Brother        Not aware of details. --By report has a pacemaker.   Heart failure Brother     Past Surgical History:  Procedure Laterality Date   LIPOMA EXCISION Right 02/03/2021   Procedure: Excision of abdominal lipoma;  Surgeon: Lowery Estefana RAMAN, DO;  Location: [facility];  Service: Plastics;  Laterality: Right;   TRANSTHORACIC ECHOCARDIOGRAM  06/05/2017   [facility]: Mild concentric LVH.  EF 60 to 65%.  No R WMA.  GR 1 DD.  Normal RV size with mildly reduced function.  Unable assess RV pressures.  Grossly normal aortic valve.   TRIGGER FINGER RELEASE Left    Social History   Occupational History    Employer: [employer]    Comment: Dentist  Tobacco Use   Smoking status: Former    Current packs/day: 0.00    Types: Cigarettes    Quit date: 04/12/2010    Years since quitting: 14.0   Smokeless tobacco: Never  Substance and Sexual Activity   Alcohol use: Yes    Alcohol/week: 2.0 standard drinks of alcohol    Types: 2 drink(s) per week    Comment: beer on weekend   Drug use: No   Sexual activity: Yes

## 2024-04-16 ENCOUNTER — Other Ambulatory Visit

## 2024-04-16 ENCOUNTER — Inpatient Hospital Stay: Admission: RE | Admit: 2024-04-16 | Source: Ambulatory Visit

## 2024-04-28 ENCOUNTER — Ambulatory Visit: Admitting: Orthopedic Surgery

## 2024-07-18 ENCOUNTER — Ambulatory Visit: Admitting: Orthopedic Surgery

## 2024-07-18 ENCOUNTER — Other Ambulatory Visit: Payer: Self-pay

## 2024-07-18 DIAGNOSIS — M25512 Pain in left shoulder: Secondary | ICD-10-CM

## 2024-07-18 DIAGNOSIS — M7502 Adhesive capsulitis of left shoulder: Secondary | ICD-10-CM

## 2024-07-19 ENCOUNTER — Encounter: Payer: Self-pay | Admitting: Orthopedic Surgery

## 2024-07-19 MED ORDER — BUPIVACAINE HCL 0.5 % IJ SOLN
9.0000 mL | INTRAMUSCULAR | Status: AC | PRN
  Administered 2024-07-18: 9 mL via INTRA_ARTICULAR

## 2024-07-19 MED ORDER — TRIAMCINOLONE ACETONIDE 40 MG/ML IJ SUSP
40.0000 mg | INTRAMUSCULAR | Status: AC | PRN
  Administered 2024-07-18: 40 mg via INTRA_ARTICULAR

## 2024-07-19 MED ORDER — LIDOCAINE HCL 1 % IJ SOLN
5.0000 mL | INTRAMUSCULAR | Status: AC | PRN
  Administered 2024-07-18: 5 mL

## 2024-07-19 NOTE — Progress Notes (Signed)
 "  Office Visit Note   Patient: Mathew Ramsey           Date of Birth: [birth year]           MRN: [id number] Visit Date: 07/18/2024 Requested by: No referring provider defined for this encounter. PCP: Lenon Ludie BIRCH, FNP (Inactive)  Subjective: Chief Complaint  Patient presents with   Left Shoulder - Pain    HPI: [name] is a 58 y.o. male who presents to the office reporting left shoulder pain.  Actually feels better when [place] works out.  Does have pain at night.  Definitely more stiffness since his last clinic visit.  KT tape does help.  Does take medication for this about several times a day.  He is okay with overhead press and incline bench.  Flat bench does hurt him some.  Abduction is also painful.  His forward flexion motion is somewhat restricted on the left compared to the right.  Reaching back for his coat is also painful..                ROS: All systems reviewed are negative as they relate to the chief complaint within the history of present illness.  Patient denies fevers or chills.  Assessment & Plan: Visit Diagnoses:  1. Acute pain of left shoulder     Plan: Impression is left shoulder pain and the patient who is very active and works out.  His passive range of motion has decreased since last clinic visit.  Strength is excellent.  This looks like early adhesive capsulitis.  Continue with daily anti-inflammatories and glenohumeral joint injection performed today.  6-week return for clinical recheck.  With decision for or against another injection at that time.  I do want him to continue to work on stretching of the shoulder.  No arthritis on plain radiographs.  Follow-Up Instructions: No follow-ups on file.   Orders:  Orders Placed This Encounter  Procedures   US  Guided Needle Placement - No Linked Charges   No orders of the defined types were placed in this encounter.     Procedures: Large Joint Inj: L glenohumeral on 07/18/2024 9:29 PM Indications:  diagnostic evaluation and pain Details: 22 G 3.5 in needle, ultrasound-guided posterior approach  Arthrogram: No  Medications: 9 mL bupivacaine  0.5 %; 5 mL lidocaine  1 %; 40 mg triamcinolone  acetonide 40 MG/ML Outcome: tolerated well, no immediate complications Procedure, treatment alternatives, risks and benefits explained, specific risks discussed. Consent was given by the patient. Immediately prior to procedure a time out was called to verify the correct patient, procedure, equipment, support staff and site/side marked as required. Patient was prepped and draped in the usual sterile fashion.       Clinical Data: No additional findings.  Objective: Vital Signs: There were no vitals taken for this visit.  Physical Exam:  Constitutional: Patient appears well-developed HEENT:  Head: Normocephalic Eyes:EOM are normal Neck: Normal range of motion Cardiovascular: Normal rate Pulmonary/chest: Effort normal Neurologic: Patient is alert Skin: Skin is warm Psychiatric: Patient has normal mood and affect  Ortho Exam: Ortho exam demonstrates range of motion on the right of 60/95/160 on the left is 40/60/145.  Rotator cuff strength is intact and there is no coarseness or grinding with internal or external rotation of the shoulder.  Specialty Comments:  No specialty comments available.  Imaging: No results found.   PMFS History: Patient Active Problem List   Diagnosis Date Noted   Preop cardiovascular exam 01/21/2021  Lipoma of torso 11/30/2020   DOE (dyspnea on exertion) 01/06/2020   Chest pain radiating to arm 01/02/2020   Hyperlipidemia associated with type 2 diabetes mellitus (HCC) 10/15/2014   Personal history of noncompliance with medical treatment, presenting hazards to health 10/15/2014   Hypertension associated with diabetes (HCC) 07/10/2013   Hypogonadism male 01/31/2012   Diabetes mellitus (HCC) 05/08/2011   Obesity    DM type 2 with diabetic dyslipidemia (HCC)     Sleep apnea    Past Medical History:  Diagnosis Date   Allergy    Diabetes mellitus type II, uncontrolled    Dyslipidemia    GERD (gastroesophageal reflux disease)    Hyperlipidemia    Hypertension    no CPAP    Obesity     Family History  Problem Relation Age of Onset   Diabetes Mellitus II Mother    Hypertension Mother    Hyperlipidemia Mother    Heart failure Mother    Alcohol abuse Father    Diabetes Mellitus II Sister    Heart disease Brother        Not aware of details. --By report has a pacemaker.   Heart failure Brother     Past Surgical History:  Procedure Laterality Date   LIPOMA EXCISION Right 02/03/2021   Procedure: Excision of abdominal lipoma;  Surgeon: Lowery Estefana RAMAN, DO;  Location: [facility];  Service: Plastics;  Laterality: Right;   TRANSTHORACIC ECHOCARDIOGRAM  06/05/2017   [facility]: Mild concentric LVH.  EF 60 to 65%.  No R WMA.  GR 1 DD.  Normal RV size with mildly reduced function.  Unable assess RV pressures.  Grossly normal aortic valve.   TRIGGER FINGER RELEASE Left    Social History   Occupational History    Employer: [employer]    Comment: Dentist  Tobacco Use   Smoking status: Former    Current packs/day: 0.00    Types: Cigarettes    Quit date: 04/12/2010    Years since quitting: 14.2   Smokeless tobacco: Never  Substance and Sexual Activity   Alcohol use: Yes    Alcohol/week: 2.0 standard drinks of alcohol    Types: 2 drink(s) per week    Comment: beer on weekend   Drug use: No   Sexual activity: Yes        "

## 2024-08-29 ENCOUNTER — Ambulatory Visit (INDEPENDENT_AMBULATORY_CARE_PROVIDER_SITE_OTHER): Admitting: Orthopedic Surgery

## 2024-08-29 ENCOUNTER — Encounter: Payer: Self-pay | Admitting: Orthopedic Surgery

## 2024-08-29 DIAGNOSIS — M7502 Adhesive capsulitis of left shoulder: Secondary | ICD-10-CM | POA: Diagnosis not present

## 2024-08-29 DIAGNOSIS — M25512 Pain in left shoulder: Secondary | ICD-10-CM | POA: Diagnosis not present

## 2024-08-29 NOTE — Progress Notes (Signed)
 "  Office Visit Note   Patient: Mathew Ramsey           Date of Birth: 07-Jun-1966           MRN: [id number] Visit Date: 08/29/2024 Requested by: No referring provider defined for this encounter. PCP: Lenon Ludie BIRCH, FNP (Inactive)  Subjective: Chief Complaint  Patient presents with   Left Shoulder - Pain    HPI: Mathew Ramsey is a 59 y.o. male who presents to the office reporting for follow-up of left shoulder.  He had a glenohumeral joint injection done about 6 weeks ago.  Diagnosis at that time was adhesive capsulitis of the left shoulder.  Overall the left shoulder injection did help him.  He is able to do bench press and overhead press at this time with minimal symptoms.  When he reaches back he still has a little bit of pain in the anterior aspect of the shoulder as well as reaching down to his feet.  He has not taken any anti-inflammatories.  He also describes having some paresthesias in both hands left worse than right.  That has been associated with preacher curls as well as extensive weight lifting.  Describes some slow cramping type sensation in the left hand as well.  He is right-hand dominant.  Denies any neck pain..                ROS: All systems reviewed are negative as they relate to the chief complaint within the history of present illness.  Patient denies fevers or chills.  Assessment & Plan: Visit Diagnoses:  1. Acute pain of left shoulder   2. Adhesive capsulitis of left shoulder     Plan: Impression is improvement in left shoulder pain with glenohumeral injection.  Range of motion has also improved today passively.  Plan at this time is to delay further injection into the glenohumeral joint and to continue with range of motion exercises.  Regarding the hands I think this looks like carpal tunnel.  Negative Tinel's in the cubital tunnel bilaterally.  He is going to consider volar wrist splints at night for that left-hand side as well as avoiding wrist hyperflexed  type of weightlifting activities.  EMG nerve study would be next intervention.  Follow-up with us  as needed.  Follow-Up Instructions: No follow-ups on file.   Orders:  No orders of the defined types were placed in this encounter.  No orders of the defined types were placed in this encounter.     Procedures: No procedures performed   Clinical Data: No additional findings.  Objective: Vital Signs: There were no vitals taken for this visit.  Physical Exam:  Constitutional: Patient appears well-developed HEENT:  Head: Normocephalic Eyes:EOM are normal Neck: Normal range of motion Cardiovascular: Normal rate Pulmonary/chest: Effort normal Neurologic: Patient is alert Skin: Skin is warm Psychiatric: Patient has normal mood and affect  Ortho Exam: Ortho exam demonstrates good cervical spine range of motion.  Negative Tinel's and no ulnar nerve subluxation in either elbow.  EPL FPL interosseous strength is intact with no wasting of the abductor pollicis brevis.  Bilateral shoulder range of motion is about 40/90/150.  Rotator cuff strength is excellent.  No discrete AC joint tenderness left versus right.  Specialty Comments:  No specialty comments available.  Imaging: No results found.   PMFS History: Patient Active Problem List   Diagnosis Date Noted   Preop cardiovascular exam 01/21/2021   Lipoma of torso 11/30/2020   DOE (dyspnea  on exertion) 01/06/2020   Chest pain radiating to arm 01/02/2020   Hyperlipidemia associated with type 2 diabetes mellitus (HCC) 10/15/2014   Personal history of noncompliance with medical treatment, presenting hazards to health 10/15/2014   Hypertension associated with diabetes (HCC) 07/10/2013   Hypogonadism male 01/31/2012   Diabetes mellitus (HCC) 05/08/2011   Obesity    DM type 2 with diabetic dyslipidemia (HCC)    Sleep apnea    Past Medical History:  Diagnosis Date   Allergy    Diabetes mellitus type II, uncontrolled     Dyslipidemia    GERD (gastroesophageal reflux disease)    Hyperlipidemia    Hypertension    no CPAP    Obesity     Family History  Problem Relation Age of Onset   Diabetes Mellitus II Mother    Hypertension Mother    Hyperlipidemia Mother    Heart failure Mother    Alcohol abuse Father    Diabetes Mellitus II Sister    Heart disease Brother        Not aware of details. --By report has a pacemaker.   Heart failure Brother     Past Surgical History:  Procedure Laterality Date   LIPOMA EXCISION Right 02/03/2021   Procedure: Excision of abdominal lipoma;  Surgeon: Lowery Estefana RAMAN, DO;  Location: [facility];  Service: Plastics;  Laterality: Right;   TRANSTHORACIC ECHOCARDIOGRAM  06/05/2017   [facility]: Mild concentric LVH.  EF 60 to 65%.  No R WMA.  GR 1 DD.  Normal RV size with mildly reduced function.  Unable assess RV pressures.  Grossly normal aortic valve.   TRIGGER FINGER RELEASE Left    Social History   Occupational History    Employer: [employer]    Comment: Dentist  Tobacco Use   Smoking status: Former    Current packs/day: 0.00    Types: Cigarettes    Quit date: 04/12/2010    Years since quitting: 14.3   Smokeless tobacco: Never  Substance and Sexual Activity   Alcohol use: Yes    Alcohol/week: 2.0 standard drinks of alcohol    Types: 2 drink(s) per week    Comment: beer on weekend   Drug use: No   Sexual activity: Yes        "
